# Patient Record
Sex: Female | Born: 1960
Health system: Southern US, Community
[De-identification: ages and names within clinical notes are randomized; demographics above are authoritative.]

## PROBLEM LIST (undated history)

## (undated) DIAGNOSIS — R896 Abnormal cytological findings in specimens from other organs, systems and tissues: Secondary | ICD-10-CM

## (undated) DIAGNOSIS — M810 Age-related osteoporosis without current pathological fracture: Secondary | ICD-10-CM

## (undated) DIAGNOSIS — K649 Unspecified hemorrhoids: Secondary | ICD-10-CM

## (undated) DIAGNOSIS — E785 Hyperlipidemia, unspecified: Secondary | ICD-10-CM

## (undated) DIAGNOSIS — E559 Vitamin D deficiency, unspecified: Secondary | ICD-10-CM

## (undated) DIAGNOSIS — B009 Herpesviral infection, unspecified: Secondary | ICD-10-CM

## (undated) HISTORY — DX: Hyperlipidemia, unspecified: E78.5

## (undated) HISTORY — DX: Herpesviral infection, unspecified: B00.9

## (undated) HISTORY — PX: ESOPHAGOGASTRODUODENOSCOPY: SHX1529

## (undated) HISTORY — DX: Abnormal cytological findings in specimens from other organs, systems and tissues: R89.6

## (undated) HISTORY — DX: Unspecified hemorrhoids: K64.9

## (undated) HISTORY — DX: Vitamin D deficiency, unspecified: E55.9

## (undated) HISTORY — DX: Age-related osteoporosis without current pathological fracture: M81.0

---

## 1984-07-10 HISTORY — PX: BREAST BIOPSY: SHX20

## 1995-07-11 DIAGNOSIS — IMO0001 Reserved for inherently not codable concepts without codable children: Secondary | ICD-10-CM

## 1995-07-11 HISTORY — DX: Reserved for inherently not codable concepts without codable children: IMO0001

## 1999-05-04 ENCOUNTER — Other Ambulatory Visit: Admission: RE | Admit: 1999-05-04 | Discharge: 1999-05-04 | Payer: Self-pay | Admitting: Obstetrics and Gynecology

## 2000-05-03 ENCOUNTER — Other Ambulatory Visit: Admission: RE | Admit: 2000-05-03 | Discharge: 2000-05-03 | Payer: Self-pay | Admitting: Obstetrics and Gynecology

## 2001-05-27 ENCOUNTER — Other Ambulatory Visit: Admission: RE | Admit: 2001-05-27 | Discharge: 2001-05-27 | Payer: Self-pay | Admitting: Obstetrics and Gynecology

## 2001-10-22 ENCOUNTER — Other Ambulatory Visit: Admission: RE | Admit: 2001-10-22 | Discharge: 2001-10-22 | Payer: Self-pay | Admitting: Obstetrics and Gynecology

## 2002-02-21 ENCOUNTER — Other Ambulatory Visit: Admission: RE | Admit: 2002-02-21 | Discharge: 2002-02-21 | Payer: Self-pay | Admitting: Obstetrics and Gynecology

## 2002-05-30 ENCOUNTER — Other Ambulatory Visit: Admission: RE | Admit: 2002-05-30 | Discharge: 2002-05-30 | Payer: Self-pay | Admitting: Obstetrics and Gynecology

## 2003-06-02 ENCOUNTER — Other Ambulatory Visit: Admission: RE | Admit: 2003-06-02 | Discharge: 2003-06-02 | Payer: Self-pay | Admitting: Obstetrics and Gynecology

## 2003-10-09 DIAGNOSIS — B009 Herpesviral infection, unspecified: Secondary | ICD-10-CM

## 2003-10-09 HISTORY — DX: Herpesviral infection, unspecified: B00.9

## 2004-06-01 ENCOUNTER — Other Ambulatory Visit: Admission: RE | Admit: 2004-06-01 | Discharge: 2004-06-01 | Payer: Self-pay | Admitting: Obstetrics and Gynecology

## 2005-04-25 ENCOUNTER — Encounter: Admission: RE | Admit: 2005-04-25 | Discharge: 2005-04-25 | Payer: Self-pay | Admitting: Unknown Physician Specialty

## 2005-07-11 ENCOUNTER — Other Ambulatory Visit: Admission: RE | Admit: 2005-07-11 | Discharge: 2005-07-11 | Payer: Self-pay | Admitting: Obstetrics and Gynecology

## 2005-10-27 ENCOUNTER — Encounter: Admission: RE | Admit: 2005-10-27 | Discharge: 2005-10-27 | Payer: Self-pay | Admitting: Obstetrics & Gynecology

## 2006-08-10 ENCOUNTER — Other Ambulatory Visit: Admission: RE | Admit: 2006-08-10 | Discharge: 2006-08-10 | Payer: Self-pay | Admitting: Obstetrics & Gynecology

## 2007-08-22 ENCOUNTER — Other Ambulatory Visit: Admission: RE | Admit: 2007-08-22 | Discharge: 2007-08-22 | Payer: Self-pay | Admitting: Obstetrics & Gynecology

## 2008-08-26 ENCOUNTER — Other Ambulatory Visit: Admission: RE | Admit: 2008-08-26 | Discharge: 2008-08-26 | Payer: Self-pay | Admitting: Obstetrics & Gynecology

## 2009-02-07 LAB — HM COLONOSCOPY: HM Colonoscopy: NORMAL

## 2012-03-10 DIAGNOSIS — M810 Age-related osteoporosis without current pathological fracture: Secondary | ICD-10-CM

## 2012-03-10 HISTORY — DX: Age-related osteoporosis without current pathological fracture: M81.0

## 2013-02-06 ENCOUNTER — Encounter: Payer: Self-pay | Admitting: Obstetrics & Gynecology

## 2013-02-10 ENCOUNTER — Encounter: Payer: Self-pay | Admitting: Obstetrics & Gynecology

## 2013-02-10 ENCOUNTER — Ambulatory Visit (INDEPENDENT_AMBULATORY_CARE_PROVIDER_SITE_OTHER): Payer: BC Managed Care – PPO | Admitting: Obstetrics & Gynecology

## 2013-02-10 VITALS — BP 110/66 | HR 60 | Resp 16 | Ht 63.75 in | Wt 136.0 lb

## 2013-02-10 DIAGNOSIS — Z01419 Encounter for gynecological examination (general) (routine) without abnormal findings: Secondary | ICD-10-CM

## 2013-02-10 MED ORDER — VALACYCLOVIR HCL 500 MG PO TABS: 500.0000 mg | ORAL_TABLET | Freq: Every day | ORAL | Status: DC

## 2013-02-10 MED ORDER — ALENDRONATE SODIUM 70 MG PO TABS: 70.0000 mg | ORAL_TABLET | ORAL | Status: DC

## 2013-02-10 MED ORDER — FLUCONAZOLE 150 MG PO TABS: 150.0000 mg | ORAL_TABLET | Freq: Once | ORAL | Status: DC

## 2013-02-10 MED ORDER — ESTROGENS, CONJUGATED 0.625 MG/GM VA CREA: TOPICAL_CREAM | Freq: Every day | VAGINAL | Status: DC

## 2013-02-10 NOTE — Patient Instructions (Signed)

## 2013-02-10 NOTE — Progress Notes (Signed)
52 y.o. G19P1001 Married WF here for annual exam.  Last year reported having recurrent itchy vulvar lesions.  She came in a saw Shirlyn Goltz and was diagnosed with HSV.  Now on Valtrex but not using as suppression.  Will call if wants to change this.  Had three outbreaks during last year.    Patient's last menstrual period was 07/10/2005.          Sexually active: yes  The current method of family planning is post menopausal status.    Exercising: yes   Smoker:  no  Health Maintenance: Pap:  02/01/12 WNL/negative HR HPV History of abnormal Pap:  yes MMG:  01/30/12 normal Colonoscopy:  8/10 repeat in 10 years, Dr. Loreta Ave BMD:   02/28/12 osteoporosis TDaP:  5/08 Screening Labs: at work, Hb today: at work, Urine today: at work   reports that she has never smoked. She has never used smokeless tobacco. She reports that she drinks about 0.5 ounces of alcohol per week. She reports that she does not use illicit drugs.  Past Medical History  Diagnosis Date  . Cervicitis 10/95  . ASCUS on Pap smear 1/97  . HSV-2 (herpes simplex virus 2) infection 4/05  . Osteoporosis 9/13  . Vitamin D deficiency   . Hemorrhoid   . Mastodynia     cyclic  . Gastritis     Past Surgical History  Procedure Laterality Date  . Breast biopsy  1986  . Cardiolite exercise    . Esophagogastroduodenoscopy      gastritis    Current Outpatient Prescriptions  Medication Sig Dispense Refill  . alendronate (FOSAMAX) 70 MG tablet Take 70 mg by mouth every 7 (seven) days. Take with a full glass of water on an empty stomach.      . Aspirin-Acetaminophen-Caffeine (EXCEDRIN MIGRAINE PO) Take by mouth as needed.      . conjugated estrogens (PREMARIN) vaginal cream Place vaginally daily.      . Naproxen Sodium (ALEVE PO) Take by mouth as needed.       No current facility-administered medications for this visit.    History reviewed. No pertinent family history.  ROS:  Pertinent items are noted in HPI.  Otherwise, a  comprehensive ROS was negative.  Exam:   BP 110/66  Pulse 60  Resp 16  Ht 5' 3.75" (1.619 m)  Wt 136 lb (61.689 kg)  BMI 23.53 kg/m2  LMP 07/10/2005  Weight change: +2lb   Height: 5' 3.75" (161.9 cm)  Ht Readings from Last 3 Encounters:  02/10/13 5' 3.75" (1.619 m)    General appearance: alert, cooperative and appears stated age Head: Normocephalic, without obvious abnormality, atraumatic Neck: no adenopathy, supple, symmetrical, trachea midline and thyroid normal to inspection and palpation Lungs: clear to auscultation bilaterally Breasts: normal appearance, no masses or tenderness Heart: regular rate and rhythm Abdomen: soft, non-tender; bowel sounds normal; no masses,  no organomegaly Extremities: extremities normal, atraumatic, no cyanosis or edema Skin: Skin color, texture, turgor normal. No rashes or lesions Lymph nodes: Cervical, supraclavicular, and axillary nodes normal. No abnormal inguinal nodes palpated Neurologic: Grossly normal   Pelvic: External genitalia:  no lesions              Urethra:  normal appearing urethra with no masses, tenderness or lesions              Bartholins and Skenes: normal                 Vagina: normal  appearing vagina with normal color and discharge, no lesions              Cervix: no lesions              Pap taken: no Bimanual Exam:  Uterus:  normal size, contour, position, consistency, mobility, non-tender              Adnexa: normal adnexa and no mass, fullness, tenderness               Rectovaginal: Confirms               Anus:  normal sphincter tone, no lesions  A:  Well Woman with normal exam H/O vulvar HSV PMP, with atrophic changes, uses vaginal premarin Recurrent vaginal yeast Osteoporosis  P:   Mammogram yearly pap smear with neg HR HPV 7/13 BMD next year Fosamax 70 mg weekly.  #12/4 RF Diflucan 150mg  once and repeat 72 hours.  #2RF. Rx for premarin cream Rx for Valtrex 500mg  q day increase to BID x 3 days with  outbreaks return annually or prn  An After Visit Summary was printed and given to the patient.

## 2013-05-15 ENCOUNTER — Other Ambulatory Visit: Payer: Self-pay

## 2013-08-04 ENCOUNTER — Encounter: Payer: Self-pay | Admitting: Obstetrics & Gynecology

## 2013-08-04 MED ORDER — ALENDRONATE SODIUM 70 MG PO TABS: 70.0000 mg | ORAL_TABLET | ORAL | Status: DC

## 2013-08-04 NOTE — Telephone Encounter (Signed)
rx done as 90 day supply per pt request.

## 2013-08-06 MED ORDER — ALENDRONATE SODIUM 70 MG PO TABS: 70.0000 mg | ORAL_TABLET | ORAL | Status: DC

## 2013-08-06 NOTE — Telephone Encounter (Signed)
rx to pharmacy per pt request.  84 day supply.  Notified via MyChart of this being done.

## 2013-08-06 NOTE — Addendum Note (Signed)
Addended by: Jerene BearsMILLER, Shatara Stanek S on: 08/06/2013 11:47 AM   Modules accepted: Orders

## 2013-08-19 ENCOUNTER — Telehealth: Payer: Self-pay

## 2013-08-19 NOTE — Telephone Encounter (Signed)
Lmtcb//kn 

## 2013-12-22 NOTE — Telephone Encounter (Signed)
RX called to pharmacy//kn

## 2014-03-03 ENCOUNTER — Telehealth: Payer: Self-pay | Admitting: Obstetrics & Gynecology

## 2014-03-03 NOTE — Telephone Encounter (Signed)
lmtcb re: dr cx aex 03/10/14 and rs'd to 03/09/14.

## 2014-03-05 NOTE — Telephone Encounter (Signed)
Confirmed.

## 2014-03-09 ENCOUNTER — Encounter: Payer: Self-pay | Admitting: Obstetrics & Gynecology

## 2014-03-09 ENCOUNTER — Ambulatory Visit (INDEPENDENT_AMBULATORY_CARE_PROVIDER_SITE_OTHER): Payer: BC Managed Care – PPO | Admitting: Obstetrics & Gynecology

## 2014-03-09 VITALS — BP 108/60 | HR 60 | Ht 63.75 in | Wt 147.0 lb

## 2014-03-09 DIAGNOSIS — Z124 Encounter for screening for malignant neoplasm of cervix: Secondary | ICD-10-CM

## 2014-03-09 DIAGNOSIS — Z01419 Encounter for gynecological examination (general) (routine) without abnormal findings: Secondary | ICD-10-CM

## 2014-03-09 MED ORDER — ESTROGENS, CONJUGATED 0.625 MG/GM VA CREA: TOPICAL_CREAM | VAGINAL | Status: DC

## 2014-03-09 MED ORDER — VALACYCLOVIR HCL 500 MG PO TABS: 500.0000 mg | ORAL_TABLET | Freq: Every day | ORAL | Status: DC

## 2014-03-09 NOTE — Progress Notes (Signed)
53 y.o. G1P1001 MarriedCaucasianF here for annual exam.  Doing well.  Brought labs with her.  Reviewed with her.  Vit D was 21 with labs at work.  Wants to know what to take.  Discussed with pt.  No vaginal bleeding.         Sexually active: Yes.    The current method of family planning is post menopausal status.    Exercising: Yes.    Home exercise routine includes walking , elliptical and stationary bike. Smoker:  no  Health Maintenance: Pap:  01/30/12 WNL, neg HR HPV History of abnormal Pap:  yes MMG:  05/20/13 Bi-Rads Neg Colonoscopy:  8/10 repeat in 10 years, Dr. Loreta Ave BMD:   02/28/12 osteoporosis.  Repeat this year. TDaP:  11/2006 Screening Labs: Pt brought results, Hb today: Pt brought results, Urine today: Neg, ph: 5.0   reports that she has never smoked. She has never used smokeless tobacco. She reports that she drinks about .5 - 1 ounces of alcohol per week. She reports that she does not use illicit drugs.  Past Medical History  Diagnosis Date  . ASCUS on Pap smear 1/97  . HSV-2 (herpes simplex virus 2) infection 4/05  . Osteoporosis 9/13  . Vitamin D deficiency   . Hemorrhoid     Past Surgical History  Procedure Laterality Date  . Breast biopsy  1986  . Esophagogastroduodenoscopy      Dr. Loreta Ave    Current Outpatient Prescriptions  Medication Sig Dispense Refill  . alendronate (FOSAMAX) 70 MG tablet Take 1 tablet (70 mg total) by mouth every 7 (seven) days. Take with a full glass of water on an empty stomach.  12 tablet  4  . Aspirin-Acetaminophen-Caffeine (EXCEDRIN MIGRAINE PO) Take by mouth as needed.      . conjugated estrogens (PREMARIN) vaginal cream Place vaginally daily.  42.5 g  4  . Naproxen Sodium (ALEVE PO) Take by mouth as needed.      . valACYclovir (VALTREX) 500 MG tablet Take 1 tablet (500 mg total) by mouth daily. 1 tablet po QD.  30 tablet  2  . fluconazole (DIFLUCAN) 150 MG tablet Take 1 tablet (150 mg total) by mouth once. Take one tablet.  Repeat in  72 hours if symptoms are not completely resolved.  2 tablet  2   No current facility-administered medications for this visit.    No family history on file.  ROS:  Pertinent items are noted in HPI.  Otherwise, a comprehensive ROS was negative.  Exam:   BP 108/60  Pulse 60  Ht 5' 3.75" (1.619 m)  Wt 147 lb (66.679 kg)  BMI 25.44 kg/m2  LMP 01/26/2013  Weight change: +11#   Height: 5' 3.75" (161.9 cm)  Ht Readings from Last 3 Encounters:  03/09/14 5' 3.75" (1.619 m)  02/10/13 5' 3.75" (1.619 m)    General appearance: alert, cooperative and appears stated age Head: Normocephalic, without obvious abnormality, atraumatic Neck: no adenopathy, supple, symmetrical, trachea midline and thyroid normal to inspection and palpation Lungs: clear to auscultation bilaterally Breasts: normal appearance, no masses or tenderness Heart: regular rate and rhythm Abdomen: soft, non-tender; bowel sounds normal; no masses,  no organomegaly Extremities: extremities normal, atraumatic, no cyanosis or edema Skin: Skin color, texture, turgor normal. No rashes or lesions Lymph nodes: Cervical, supraclavicular, and axillary nodes normal. No abnormal inguinal nodes palpated Neurologic: Grossly normal   Pelvic: External genitalia:  no lesions  Urethra:  normal appearing urethra with no masses, tenderness or lesions              Bartholins and Skenes: normal                 Vagina: normal appearing vagina with normal color and discharge, no lesions              Cervix: no lesions              Pap taken: Yes.   Bimanual Exam:  Uterus:  normal size, contour, position, consistency, mobility, non-tender              Adnexa: normal adnexa and no mass, fullness, tenderness               Rectovaginal: Confirms               Anus:  normal sphincter tone, no lesions  A:  Well Woman with normal exam  H/O vulvar HSV  PMP, with atrophic changes, uses vaginal premarin  Osteoporosis   P: Mammogram  yearly  pap smear with neg HR HPV 7/13.  Pap only today. BMD with MMG in November. Fosamax 70 mg weekly.  No rx needed yet. Rx for premarin cream  Rx for Valtrex  q day increase to BID x 3 days with outbreaks  return annually or prn  An After Visit Summary was printed and given to the patient.

## 2014-03-10 ENCOUNTER — Ambulatory Visit: Payer: BC Managed Care – PPO | Admitting: Obstetrics & Gynecology

## 2014-03-11 LAB — IPS PAP TEST WITH REFLEX TO HPV

## 2014-05-11 ENCOUNTER — Encounter: Payer: Self-pay | Admitting: Obstetrics & Gynecology

## 2014-06-23 ENCOUNTER — Telehealth: Payer: Self-pay

## 2014-06-23 NOTE — Telephone Encounter (Signed)
Patient is returning a call to Kelly °

## 2014-06-23 NOTE — Telephone Encounter (Signed)
lmtcb to discuss BMD from [facility]-chart on [place] desk//kn

## 2014-06-24 NOTE — Telephone Encounter (Signed)
Pt is scheduled for 07/08/14 @ 3pm with Dr. Hyacinth MeekerMiller.  [facility]

## 2014-07-08 ENCOUNTER — Encounter: Payer: Self-pay | Admitting: Obstetrics & Gynecology

## 2014-07-08 ENCOUNTER — Ambulatory Visit (INDEPENDENT_AMBULATORY_CARE_PROVIDER_SITE_OTHER): Payer: BC Managed Care – PPO | Admitting: Obstetrics & Gynecology

## 2014-07-08 VITALS — BP 110/64 | Ht 63.75 in | Wt 139.6 lb

## 2014-07-08 DIAGNOSIS — M810 Age-related osteoporosis without current pathological fracture: Secondary | ICD-10-CM

## 2014-07-08 MED ORDER — ALENDRONATE SODIUM 70 MG PO TABS: 70.0000 mg | ORAL_TABLET | ORAL | Status: DC

## 2014-07-08 NOTE — Progress Notes (Signed)
53 yo G1P1 MWF here to discuss [facility].  Pt with hx of osteoporosis with T score -2.9 (average measurement) in spine.  Last BMD was a little over two years ago.  Pt started on Foxamax at that time and has done well with it.  This BMD continues to show osteoporosis in spine but is improved with T score average of -2.5.  This represents a 6% increase in BMD.  Also, both hip measurements are improved as well.  Both still in the osteopenia range.   Pt is not taking supplemental calcium.  D/W pt needs total of 1200-1500mg  calcium daily (including dietary) as well as Vit D.  She is supplementing with Vit D.  Pt is going to do a "dairy diary" for a week and average what she is getting so she knows how much to supplement.  She will continue with [phone] IU Vit D daily.  Assessment:  Osteoporosis but improved with most recent BMD.  T Score now -2.5  Plan:  Continue Fosamax weekly.  70mg  first thing in am and on empty stomach for 1 hour.  #12/4 RF Increase calcium supplementation Continue Vit D supplements Continue with weight bearing exercise (pt exercises 4-5 times weekly)  ~15 minutes spent with patient >50% of time was in face to face discussion of above.

## 2014-10-27 ENCOUNTER — Encounter: Payer: Self-pay | Admitting: Obstetrics & Gynecology

## 2014-10-27 MED ORDER — ALENDRONATE SODIUM 70 MG PO TABS: 70.0000 mg | ORAL_TABLET | ORAL | Status: DC

## 2014-10-27 NOTE — Telephone Encounter (Signed)
Rx sent to pharmacy after pt request from MyChart from pt.

## 2015-04-13 ENCOUNTER — Ambulatory Visit: Payer: BC Managed Care – PPO | Admitting: Obstetrics & Gynecology

## 2015-05-12 ENCOUNTER — Telehealth: Payer: Self-pay | Admitting: Obstetrics & Gynecology

## 2015-05-12 NOTE — Telephone Encounter (Signed)
Left patient a message to call back to reschedule a future appointment that was cancelled by the provider. °

## 2015-05-19 ENCOUNTER — Encounter: Payer: Self-pay | Admitting: Nurse Practitioner

## 2015-05-19 ENCOUNTER — Ambulatory Visit (INDEPENDENT_AMBULATORY_CARE_PROVIDER_SITE_OTHER): Payer: BLUE CROSS/BLUE SHIELD | Admitting: Nurse Practitioner

## 2015-05-19 VITALS — BP 96/76 | HR 62 | Resp 14 | Ht 63.5 in | Wt 140.0 lb

## 2015-05-19 DIAGNOSIS — Z01419 Encounter for gynecological examination (general) (routine) without abnormal findings: Secondary | ICD-10-CM | POA: Diagnosis not present

## 2015-05-19 MED ORDER — VALACYCLOVIR HCL 500 MG PO TABS: 500.0000 mg | ORAL_TABLET | Freq: Every day | ORAL | Status: DC

## 2015-05-19 MED ORDER — ESTROGENS, CONJUGATED 0.625 MG/GM VA CREA: TOPICAL_CREAM | VAGINAL | Status: DC

## 2015-05-19 NOTE — Progress Notes (Signed)
54 y.o. G39P1001 Married  Caucasian Fe here for annual exam.  No new diagnoses since last here.  Patient's last menstrual period was 07/10/2002.          Sexually active: Yes.    The current method of family planning is post menopausal status.    Exercising: Yes.    Walking at least 30 min 5 x weekly Smoker:  no  Health Maintenance: Pap:  03/09/14 Neg MMG:  06/01/14 BIRADS1:Neg - will schedule Colonoscopy:  02/2009 Normal - repeat 10 years 11/2014 IFOB was neg BMD:   06/01/14 Osteoporosis =  Fosamax since 02/2012 TDaP:  11/29/2006 Labs: at work   reports that she has never smoked. She has never used smokeless tobacco. She reports that she drinks about 0.5 - 1.0 oz of alcohol per week. She reports that she does not use illicit drugs.  Past Medical History  Diagnosis Date  . ASCUS on Pap smear 1/97  . HSV-2 (herpes simplex virus 2) infection 4/05  . Osteoporosis 9/13  . Vitamin D deficiency   . Hemorrhoid     Past Surgical History  Procedure Laterality Date  . Breast biopsy  1986  . Esophagogastroduodenoscopy      Dr. Loreta Ave    Current Outpatient Prescriptions  Medication Sig Dispense Refill  . alendronate (FOSAMAX) 70 MG tablet Take 1 tablet (70 mg total) by mouth every 7 (seven) days. Take with a full glass of water on an empty stomach. 12 tablet 4  . Aspirin-Acetaminophen-Caffeine (EXCEDRIN MIGRAINE PO) Take by mouth as needed.    . conjugated estrogens (PREMARIN) vaginal cream 1/2 gram vaginally twice weekly 42.5 g 4  . Naproxen Sodium (ALEVE PO) Take by mouth as needed.    . valACYclovir (VALTREX) 500 MG tablet Take 1 tablet (500 mg total) by mouth daily. 1 tablet po QD. 90 tablet 4  . fluconazole (DIFLUCAN) 150 MG tablet Take 1 tablet (150 mg total) by mouth once. Take one tablet.  Repeat in 72 hours if symptoms are not completely resolved. (Patient not taking: Reported on 05/19/2015) 2 tablet 2   No current facility-administered medications for this visit.    History  reviewed. No pertinent family history.  ROS:  Pertinent items are noted in HPI.  Otherwise, a comprehensive ROS was negative.  Exam:   BP 96/76 mmHg  Pulse 62  Resp 14  Ht 5' 3.5" (1.613 m)  Wt 140 lb (63.504 kg)  BMI 24.41 kg/m2  LMP 07/10/2002 Height: 5' 3.5" (161.3 cm) Ht Readings from Last 3 Encounters:  05/19/15 5' 3.5" (1.613 m)  07/08/14 5' 3.75" (1.619 m)  03/09/14 5' 3.75" (1.619 m)    General appearance: alert, cooperative and appears stated age Head: Normocephalic, without obvious abnormality, atraumatic Neck: no adenopathy, supple, symmetrical, trachea midline and thyroid normal to inspection and palpation Lungs: clear to auscultation bilaterally Breasts: normal appearance, no masses or tenderness Heart: regular rate and rhythm Abdomen: soft, non-tender; no masses,  no organomegaly Extremities: extremities normal, atraumatic, no cyanosis or edema Skin: Skin color, texture, turgor normal. No rashes or lesions Lymph nodes: Cervical, supraclavicular, and axillary nodes normal. No abnormal inguinal nodes palpated Neurologic: Grossly normal   Pelvic: External genitalia:  no lesions              Urethra:  normal appearing urethra with no masses, tenderness or lesions              Bartholin's and Skene's: normal  Vagina: normal appearing vagina with normal color and discharge, no lesions              Cervix: anteverted              Pap taken: Yes.   Bimanual Exam:  Uterus:  normal size, contour, position, consistency, mobility, non-tender              Adnexa: no mass, fullness, tenderness               Rectovaginal: Confirms               Anus:  normal sphincter tone, no lesions  Chaperone present: yes  A:  Well Woman with normal exam  Postmenopausal - no HRT  H/O HSVII - culture proven 10/2003  PMP, with atrophic changes, uses vaginal Premarin   Osteoporosis on Fosamax since 02/2012   P:   Reviewed health and wellness pertinent to exam  Pap  smear as above  Mammogram is due end of this month and will schedule  Refill on Premarin vaginal cream, Valtrex, she does not need a refill on Fosamax yet.  Will update labs  Counseled on breast self exam, mammography screening, adequate intake of calcium and vitamin D, diet and exercise, Kegel's exercises return annually or prn  An After Visit Summary was printed and given to the patient.

## 2015-05-19 NOTE — Patient Instructions (Signed)

## 2015-05-20 LAB — HEPATITIS C ANTIBODY: HCV Ab: NEGATIVE

## 2015-05-20 LAB — HIV ANTIBODY (ROUTINE TESTING W REFLEX): HIV 1&2 Ab, 4th Generation: NONREACTIVE

## 2015-05-21 LAB — IPS PAP TEST WITH HPV

## 2015-05-24 NOTE — Progress Notes (Signed)
Encounter reviewed by Dr. Brook Amundson C. Silva.  

## 2015-07-06 ENCOUNTER — Ambulatory Visit: Payer: Self-pay | Admitting: Obstetrics & Gynecology

## 2015-09-06 ENCOUNTER — Telehealth: Payer: Self-pay | Admitting: Nurse Practitioner

## 2015-09-06 NOTE — Telephone Encounter (Signed)
Spoke with patient. Advised of message as seen below from Dr.Miller. She is agreeable and verbalizes understanding. She will call with an update on Friday 09/10/2015.  Routing to provider for final review. Patient agreeable to disposition. Will close encounter.

## 2015-09-06 NOTE — Telephone Encounter (Signed)
Take tums up to four times daily and Zantac  bid for two to three days.  She needs to give update in two to three days.  Should be better.  Hold future Fosamax until this is resolved.  If doesn't get better quickly, may need GI evaluation.  Will try OTC products to start with right now.

## 2015-09-06 NOTE — Telephone Encounter (Signed)
Patient calling with concerns about Fosamax. She said, "I am having symptoms of heart burn and burning in my upper chest."

## 2015-09-06 NOTE — Telephone Encounter (Signed)
Spoke with patient. Patient states she has been taking Fosamax 70 mg once per week for years without any side effects. Took last dose of Fosamaz 70 mg on 08/31/2015. On Saturday 09/04/2015 she began to have "heart burn" mid way through the morning that has persisted ever since. States she had eaten 2-3 hours prior to heartburn beginning. "It feels like a burning in the back on my throat and into my chest." Denies any SOB, chest pain, or pain radiating to her left arm. Reports feeling like she needs to eat or drink to relieve discomfort. Eating and drinking does not relieve or aggravate symptoms. Reports taking her Fosamax on an empty stomach with water. She has not tried any OTC medication for relief. Patient is concerned as this symptom appeared suddenly. States she has heartburn 1-2 times per year. "It usually goes away and this hasn't." Advised I will speak with Dr.Miller and return call with further recommendations. She is agreeable.

## 2015-09-10 ENCOUNTER — Encounter: Payer: Self-pay | Admitting: Obstetrics & Gynecology

## 2015-09-10 ENCOUNTER — Telehealth: Payer: Self-pay

## 2015-09-10 NOTE — Telephone Encounter (Signed)
Telephone encounter created for review with Dr.Miller. 

## 2015-09-10 NOTE — Telephone Encounter (Signed)
I would restart the Fosamax but if the reflux occurs again, stop and restart the TUMS and Zantac.  I would then recommend an appt to discuss options.

## 2015-09-10 NOTE — Telephone Encounter (Signed)
Visit Follow-Up Question  Message [id number]   From  Rhunette CroftKimberly J Arena   To  Jerene BearsMary S Miller, MD   Sent  09/10/2015 8:27 AM     Monday, (09/06/2015) you advised OTC Zantac 75 mg (2X per day) and TUMS (4X per day) for esophageal burning-possibly related to Fosomax. Symptoms disappeared completely by evening of 2/28. Should I resume my Fosomax dose next week? If yes, if symptoms return, treat with OTC meds? For what length of time before contacting your office again?   Annabell SabalKimberly Silva [phone]      Responsible Party    Pool - [facility] Pool No one has taken responsibility for this message.     No actions have been taken on this message.     Patient is providing follow up regarding recommendations given on 09/06/2015 (please see telephone encounter). Routing to Dr.Miller for review and advise of mychart message.

## 2015-09-13 NOTE — Telephone Encounter (Signed)
Spoke with patient. Advised of message as seen below from Dr.Miller. She is agreeable and verbalizes understanding. She will restart Fosamax tomorrow and schedule OV if reflux reoccurs.   Routing to provider for final review. Patient agreeable to disposition. Will close encounter.

## 2015-12-20 ENCOUNTER — Other Ambulatory Visit: Payer: Self-pay | Admitting: Obstetrics & Gynecology

## 2015-12-20 NOTE — Telephone Encounter (Signed)
Medication refill request: Fosamax Last AEX:  05-19-15 Next AEX: 05-19-16 Last MMG (if hormonal medication request): 11-23-15WNL  Last DEXA: 06-01-14 osteoporosis  Refill authorized: please advise

## 2016-04-26 ENCOUNTER — Telehealth: Payer: Self-pay | Admitting: Nurse Practitioner

## 2016-04-26 NOTE — Telephone Encounter (Signed)
LMTCB about canceled appointment °

## 2016-05-15 ENCOUNTER — Encounter: Payer: Self-pay | Admitting: Obstetrics & Gynecology

## 2016-05-19 ENCOUNTER — Ambulatory Visit: Payer: BLUE CROSS/BLUE SHIELD | Admitting: Nurse Practitioner

## 2016-06-07 DIAGNOSIS — Z1231 Encounter for screening mammogram for malignant neoplasm of breast: Secondary | ICD-10-CM | POA: Diagnosis not present

## 2016-06-07 DIAGNOSIS — M810 Age-related osteoporosis without current pathological fracture: Secondary | ICD-10-CM | POA: Diagnosis not present

## 2016-06-13 ENCOUNTER — Ambulatory Visit (INDEPENDENT_AMBULATORY_CARE_PROVIDER_SITE_OTHER): Payer: BLUE CROSS/BLUE SHIELD | Admitting: Certified Nurse Midwife

## 2016-06-13 ENCOUNTER — Encounter: Payer: Self-pay | Admitting: Certified Nurse Midwife

## 2016-06-13 VITALS — BP 98/62 | HR 70 | Resp 16 | Ht 63.75 in | Wt 150.0 lb

## 2016-06-13 DIAGNOSIS — Z23 Encounter for immunization: Secondary | ICD-10-CM

## 2016-06-13 DIAGNOSIS — Z01419 Encounter for gynecological examination (general) (routine) without abnormal findings: Secondary | ICD-10-CM | POA: Diagnosis not present

## 2016-06-13 DIAGNOSIS — N390 Urinary tract infection, site not specified: Secondary | ICD-10-CM

## 2016-06-13 DIAGNOSIS — Z0000 Encounter for general adult medical examination without abnormal findings: Secondary | ICD-10-CM

## 2016-06-13 DIAGNOSIS — N952 Postmenopausal atrophic vaginitis: Secondary | ICD-10-CM

## 2016-06-13 LAB — POCT URINALYSIS DIPSTICK
Bilirubin, UA: NEGATIVE
Blood, UA: NEGATIVE
Glucose, UA: NEGATIVE
Ketones, UA: NEGATIVE
Leukocytes, UA: NEGATIVE
Nitrite, UA: NEGATIVE
Protein, UA: NEGATIVE
Urobilinogen, UA: NEGATIVE
pH, UA: 5

## 2016-06-13 MED ORDER — ESTROGENS, CONJUGATED 0.625 MG/GM VA CREA: TOPICAL_CREAM | VAGINAL | 4 refills | Status: DC

## 2016-06-13 MED ORDER — NITROFURANTOIN MONOHYD MACRO 100 MG PO CAPS: 100.0000 mg | ORAL_CAPSULE | Freq: Two times a day (BID) | ORAL | 0 refills | Status: DC

## 2016-06-13 NOTE — Patient Instructions (Signed)
EXERCISE AND DIET:  We recommended that you start or continue a regular exercise program for good health. Regular exercise means any activity that makes your heart beat faster and makes you sweat.  We recommend exercising at least 30 minutes per day at least 3 days a week, preferably 4 or 5.  We also recommend a diet low in fat and sugar.  Inactivity, poor dietary choices and obesity can cause diabetes, heart attack, stroke, and kidney damage, among others.    ALCOHOL AND SMOKING:  Women should limit their alcohol intake to no more than 7 drinks/beers/glasses of wine (combined, not each!) per week. Moderation of alcohol intake to this level decreases your risk of breast cancer and liver damage. And of course, no recreational drugs are part of a healthy lifestyle.  And absolutely no smoking or even second hand smoke. Most people know smoking can cause heart and lung diseases, but did you know it also contributes to weakening of your bones? Aging of your skin?  Yellowing of your teeth and nails?  CALCIUM AND VITAMIN D:  Adequate intake of calcium and Vitamin D are recommended.  The recommendations for exact amounts of these supplements seem to change often, but generally speaking 600 mg of calcium (either carbonate or citrate) and 800 units of Vitamin D per day seems prudent. Certain women may benefit from higher intake of Vitamin D.  If you are among these women, your doctor will have told you during your visit.    PAP SMEARS:  Pap smears, to check for cervical cancer or precancers,  have traditionally been done yearly, although recent scientific advances have shown that most women can have pap smears less often.  However, every woman still should have a physical exam from her gynecologist every 57 years. It will include a breast check, inspection of the vulva and vagina to check for abnormal growths or skin changes, a visual exam of the cervix, and then an exam to evaluate the size and shape of the uterus and  ovaries.  And after 57 years of age, a rectal exam is indicated to check for rectal cancers. We will also provide age appropriate advice regarding health maintenance, like when you should have certain vaccines, screening for sexually transmitted diseases, bone density testing, colonoscopy, mammograms, etc.   MAMMOGRAMS:  All women over 57 years old should have a yearly mammogram. Many facilities now offer a "3D" mammogram, which may cost around $50 extra out of pocket. If possible,  we recommend you accept the option to have the 3D mammogram performed.  It both reduces the number of women who will be called back for extra views which then turn out to be normal, and it is better than the routine mammogram at detecting truly abnormal areas.    COLONOSCOPY:  Colonoscopy to screen for colon cancer is recommended for all women at age 57.  We know, you hate the idea of the prep.  We agree, BUT, having colon cancer and not knowing it is worse!!  Colon cancer so often starts as a polyp that can be seen and removed at colonscopy, which can quite literally save your life!  And if your first colonoscopy is normal and you have no family history of colon cancer, most women don't have to have it again for 10 years.  Once every ten years, you can do something that may end up saving your life, right?  We will be happy to help you get it scheduled when you are ready.    Be sure to check your insurance coverage so you understand how much it will cost.  It may be covered as a preventative service at no cost, but you should check your particular policy.      Atrophic Vaginitis Introduction Atrophic vaginitis is when the tissues that line the vagina become dry and thin. This is caused by a drop in estrogen. Estrogen helps:  To keep the vagina moist.  To make a clear fluid that helps:  To lubricate the vagina for sex.  To protect the vagina from infection. If the lining of the vagina is dry and thin, it may:  Make sex  painful. It may also cause bleeding.  Cause a feeling of:  Burning.  Irritation.  Itchiness.  Make an exam of your vagina painful. It may also cause bleeding.  Make you lose interest in sex.  Cause a burning feeling when you pee.  Make your vaginal fluid (discharge) [place] or yellow. For some women, there are no symptoms. This condition is most common in women who do not get their regular menstrual periods anymore (menopause). This often starts when a woman is 57-57 years old. Follow these instructions at home:  Take medicines only as told by your doctor. Do not use any herbal or alternative medicines unless your doctor says it is okay.  Use over-the-counter products for dryness only as told by your doctor. These include:  Creams.  Lubricants.  Moisturizers.  Do not douche.  Do not use products that can make your vagina dry. These include:  Scented feminine sprays.  Scented tampons.  Scented soaps.  If it hurts to have sex, tell your sexual partner. Contact a doctor if:  Your discharge looks different than normal.  Your vagina has an unusual smell.  You have new symptoms.  Your symptoms do not get better with treatment.  Your symptoms get worse. This information is not intended to replace advice given to you by your health care provider. Make sure you discuss any questions you have with your health care provider. Document Released: 12/13/2007 Document Revised: 12/02/2015 Document Reviewed: 06/17/2014  2017 Elsevier  

## 2016-06-13 NOTE — Progress Notes (Signed)
55 y.o. 281P1001 Married  Caucasian Fe here for annual exam. Menopausal no HRT. Using Premarin Cream cream for vaginal dryness once weekly only with some change. Has had two UTI's after the last two acts of sexual activity. Needs help with management of vaginal dryness. No other health issues today. [facility] with her.Sees PCP prn. Planning a big Christmas!  Patient's last menstrual period was 07/10/2002.          Sexually active: Yes.    The current method of family planning is post menopausal status.    Exercising: Yes.    walk, elliptical Smoker:  no  Health Maintenance: Pap:  05-19-15 neg HPV HR neg MMG:  06-07-16 Colonoscopy:  2010 f/u 2484yrs  BMD:   06-07-16 TDaP:  2008 Shingles: no Pneumonia: no Hep C and HIV: HIV & hep c neg 2016 Labs: poct urine-neg Self breast exam: done monthly   reports that she has never smoked. She has never used smokeless tobacco. She reports that she drinks about 0.6 oz of alcohol per week . She reports that she does not use drugs.  Past Medical History:  Diagnosis Date  . ASCUS on Pap smear 1/97  . Hemorrhoid   . HSV-2 (herpes simplex virus 2) infection 4/05  . Osteoporosis 9/13  . Vitamin D deficiency     Past Surgical History:  Procedure Laterality Date  . BREAST BIOPSY  1986  . ESOPHAGOGASTRODUODENOSCOPY     Dr. Loreta AveMann    Current Outpatient Prescriptions  Medication Sig Dispense Refill  . alendronate (FOSAMAX) 70 MG tablet TAKE 1 TABLET BY MOUTH EVERY 7 DAYS. TAKE WITH A FULL GLASS OF WATER ON AN EMPTY STOMACH 12 tablet 4  . Aspirin-Acetaminophen-Caffeine (EXCEDRIN MIGRAINE PO) Take by mouth as needed.    . conjugated estrogens (PREMARIN) vaginal cream 1/2 gram vaginally twice weekly 42.5 g 4  . Naproxen Sodium (ALEVE PO) Take by mouth as needed.    . valACYclovir (VALTREX) 500 MG tablet Take 1 tablet (500 mg total) by mouth daily. 1 tablet po QD. 90 tablet 4   No current facility-administered medications for  this visit.     History reviewed. No pertinent family history.  ROS:  Pertinent items are noted in HPI.  Otherwise, a comprehensive ROS was negative.  Exam:   BP 98/62   Pulse 70   Resp 16   Ht 5' 3.75" (1.619 m)   Wt 150 lb (68 kg)   LMP 07/10/2002   BMI 25.95 kg/m  Height: 5' 3.75" (161.9 cm) Ht Readings from Last 3 Encounters:  06/13/16 5' 3.75" (1.619 m)  05/19/15 5' 3.5" (1.613 m)  07/08/14 5' 3.75" (1.619 m)    General appearance: alert, cooperative and appears stated age Head: Normocephalic, without obvious abnormality, atraumatic Neck: no adenopathy, supple, symmetrical, trachea midline and thyroid normal to inspection and palpation Lungs: clear to auscultation bilaterally Breasts: normal appearance, no masses or tenderness, No nipple retraction or dimpling, No nipple discharge or bleeding, No axillary or supraclavicular adenopathy Heart: regular rate and rhythm Abdomen: soft, non-tender; no masses,  no organomegaly Extremities: extremities normal, atraumatic, no cyanosis or edema Skin: Skin color, texture, turgor normal. No rashes or lesions Lymph nodes: Cervical, supraclavicular, and axillary nodes normal. No abnormal inguinal nodes palpated Neurologic: Grossly normal   Pelvic: External genitalia:  no lesions              Urethra:  normal appearing urethra with no masses, tenderness or lesions  Bartholin's and Skene's: normal                 Vagina: atrophic appearing vagina with pale color and no discharge, no lesions, tender to touch              Cervix: no cervical motion tenderness and no lesions              Pap taken: No. Bimanual Exam:  Uterus:  normal size, contour, position, consistency, mobility, non-tender and anteverted              Adnexa: normal adnexa and no mass, fullness, tenderness               Rectovaginal: Confirms               Anus:  normal sphincter tone, no lesions  Chaperone present: yes  A:  Well Woman with normal  exam  Menopausal no HRT  Atrophic vaginitis with Premarin cream not working well, but not using appropriately  Post coital UTI's with treatment with PCP  Osteoporosis on Fosamax  History of herpes no outbreaks  Immunization update  P:   Reviewed health and wellness pertinent to exam  Aware of need to evaluate if vaginal bleeding  Discussed vaginal findings of atrophy and need for treatment to avoid post coital UTI and vaginal pain with sexual activity. Discussed using Premarin consistently twice weekly and starting Coconut oil nightly and prior to sexual activity. Instructions given. Patient happy with information and will call if 2 months and schedule OV if no change. Questions addressed  Rx Premarin see order.  Discussed prophylaxis treatment for avoiding UTI of Macrobid one after sexual activity to see if this will stop occurrence. Instructions given. Patient would like to try. Questions addressed.  Rx Macrobid see order  Requested recent report and will review.  Lab Vit. D  Requests TDAP  Pap smear as above not take   counseled on breast self exam, mammography screening, menopause, osteoporosis, adequate intake of calcium and vitamin D, diet and exercise  return annually or prn  An After Visit Summary was printed and given to the patient.

## 2016-06-14 LAB — VITAMIN D 25 HYDROXY (VIT D DEFICIENCY, FRACTURES): Vit D, 25-Hydroxy: 32 ng/mL (ref 30–100)

## 2016-06-15 NOTE — Progress Notes (Signed)
Encounter reviewed Jill Jertson, MD   

## 2016-06-19 ENCOUNTER — Telehealth: Payer: Self-pay | Admitting: *Deleted

## 2016-06-19 NOTE — Telephone Encounter (Signed)
Patient returning call. Please call on work number [phone].

## 2016-06-19 NOTE — Telephone Encounter (Signed)
Called patient. BMD Consult scheduled 06/20/16 @4pm 

## 2016-06-19 NOTE — Telephone Encounter (Signed)
Left voicemail to call back re: BMD results. 

## 2016-06-20 ENCOUNTER — Ambulatory Visit (INDEPENDENT_AMBULATORY_CARE_PROVIDER_SITE_OTHER): Payer: BLUE CROSS/BLUE SHIELD | Admitting: Obstetrics & Gynecology

## 2016-06-20 ENCOUNTER — Other Ambulatory Visit: Payer: Self-pay | Admitting: Obstetrics & Gynecology

## 2016-06-20 VITALS — BP 110/84 | HR 78 | Resp 16 | Ht 63.75 in | Wt 151.0 lb

## 2016-06-20 DIAGNOSIS — M810 Age-related osteoporosis without current pathological fracture: Secondary | ICD-10-CM | POA: Diagnosis not present

## 2016-06-20 NOTE — Progress Notes (Signed)
Subjective:    [age] yrs Married Caucasian [id number]  female here to discuss recent BMD osteoporosis in her spine with T score change of -2.5 to -3.1.  Hip findings showed stable osteopenia.  Due to change in findings, consultation recommended.  Reviewed BMD today with pt and changes discussed.  Work up for secondary causes discussed.  Pt and I discussed treatment options as well.  She has been on Fosamax but this has not stabilized change in her spine.  She is taking calcium and Vit D as well as doing weight bearing exercise.  No recent falls.  No hx of fractures.     Osteoporosis Risk Factors  Nonmodifiable Personal Hx of fracture as an adult: no Hx of fracture in first-degree relative: no Caucasian race: yes Advanced age: no Female sex: yes Dementia: no Poor health/frailty: no  Potentially modifiable: Tobacco use: no Low body weight (<127 lbs): no Estrogen deficiency  early menopause (age <45) or bilateral ovariectomy: yes  prolonged premenopausal amenorrhea (>1 yr): no Low calcium intake (lifelong): yes Alcohol use more than 2 drinks per day: no Recurrent falls: no Inadequate physical activity: no  Current calcium and Vit D intake:  Calcium 800mg  with 1000mg  Vit D  Review of Systems A comprehensive review of systems was negative.     Objective:   PHYSICAL EXAM BP 110/84 (BP Location: Right Arm, Patient Position: Sitting, Cuff Size: Normal)   Pulse 78   Resp 16   Ht 5' 3.75" (1.619 m)   Wt 151 lb (68.5 kg)   LMP 07/10/2002   BMI 26.12 kg/m  General appearance: alert and no distress No other exam performed today  Assessment:   Osteoporos with T score -3.1 in spine Worsening findings while on Fosamax   Plan:   1.  Patient counseled in adequate calcium and vitamin D exposure.  Calcium - 500 - 1000 mg elemental calcium/day in divided doses  Vitamin D - 800 IU/day 2.  PTH with intact calcium, CMP will be obtained.  Recent TSH and Vit D reviewed.  If normal, will  have pt do 24 hour urine for calcium as well. 3.  Medications reveiwed as well as side effects.  Feel Prolia or Reclast are best options for her.  Information provided.  She will review. 4.  Exercise recommended at least 30 minutes 3 times per week.  5.  After therapy change, will repeat BMD in 2 years.    ~30 minutes spent with patient >50% of time was in face to face discussion of above.

## 2016-06-21 ENCOUNTER — Encounter: Payer: Self-pay | Admitting: Obstetrics & Gynecology

## 2016-06-21 LAB — COMPREHENSIVE METABOLIC PANEL
ALT: 15 U/L (ref 6–29)
AST: 21 U/L (ref 10–35)
Albumin: 4.3 g/dL (ref 3.6–5.1)
Alkaline Phosphatase: 70 U/L (ref 33–130)
BUN: 12 mg/dL (ref 7–25)
CO2: 25 mmol/L (ref 20–31)
Calcium: 9.1 mg/dL (ref 8.6–10.4)
Chloride: 108 mmol/L (ref 98–110)
Creat: 0.81 mg/dL (ref 0.50–1.05)
Glucose, Bld: 91 mg/dL (ref 65–99)
Potassium: 4.5 mmol/L (ref 3.5–5.3)
Sodium: 141 mmol/L (ref 135–146)
Total Bilirubin: 0.2 mg/dL (ref 0.2–1.2)
Total Protein: 6.9 g/dL (ref 6.1–8.1)

## 2016-06-21 LAB — PTH, INTACT AND CALCIUM
Calcium: 9.1 mg/dL (ref 8.6–10.4)
PTH: 59 pg/mL (ref 14–64)

## 2016-06-21 LAB — PHOSPHORUS: Phosphorus: 3.8 mg/dL (ref 2.5–4.5)

## 2016-06-22 NOTE — Addendum Note (Signed)
Addended by: Jerene BearsMILLER, Madigan Rosensteel S on: 06/22/2016 01:16 PM   Modules accepted: Orders

## 2016-06-27 ENCOUNTER — Other Ambulatory Visit (INDEPENDENT_AMBULATORY_CARE_PROVIDER_SITE_OTHER): Payer: BLUE CROSS/BLUE SHIELD

## 2016-06-27 DIAGNOSIS — M810 Age-related osteoporosis without current pathological fracture: Secondary | ICD-10-CM

## 2016-06-28 LAB — CALCIUM, URINE, 24 HOUR
Calcium, 24 hour urine: 234 mg/24 h (ref 35–250)
Calcium, Ur: 9 mg/dL

## 2016-09-05 ENCOUNTER — Encounter: Payer: Self-pay | Admitting: Obstetrics & Gynecology

## 2016-09-07 ENCOUNTER — Telehealth: Payer: Self-pay | Admitting: *Deleted

## 2016-09-07 NOTE — Telephone Encounter (Signed)
Call to patients provider [employer] [place] to verify patients pharmacy manager. Spoke with Nolberto HanlonGio, was advised Ecologistpatients pharmacy manager, [facility] [phone].

## 2016-09-07 NOTE — Telephone Encounter (Signed)
Please precert Prolia for the patient.   Cc- Dr. Hyacinth MeekerMiller

## 2016-09-07 NOTE — Telephone Encounter (Signed)
See telephone encounter dated 09/07/16 for review with provider.  

## 2016-09-07 NOTE — Telephone Encounter (Signed)
Spoke with patient, advised will start precert process for prolia. Our insurance and benefits department will follow up with coverage. Patient is agreeable and thankful for return call.

## 2016-09-07 NOTE — Telephone Encounter (Signed)
Dr. Edward JollySilva, patient discussed treatment options at 06/20/16 OV, please advise?     From Rhunette CroftKimberly J Degrave To Jerene BearsMary S Miller, MD Sent 09/05/2016 8:58 AM  Hi Dr. Hyacinth MeekerMiller,  After 2 months of considering medication options to treat my osteoporosis diagnosis, my first treatment choice would be Denosumab (Prolia). I understand this to be a dermal injection 2 times per year. If you are in agreement, would you defend this option with my insurance company and ask your staff to determine my cost? My insurance carrier (Policy#, etc.) has not changed in 2018. As a second option, I would consider Ibandronate (Boniva) or Zoledronic Acid (Reclast); however, these medications, though delivered IV rather than oral, are similar to Fosamax and may not be effective. Your medical opinion and support are always appreciated.  Annabell SabalKimberly Michelotti    Cc: Dr. Hyacinth MeekerMiller; Braxton Feathersebecca Frahm

## 2016-09-07 NOTE — Telephone Encounter (Signed)
Left message to call Noreene LarssonJill at [phone].  Call to update patient from MyChart message.

## 2016-09-15 ENCOUNTER — Telehealth: Payer: Self-pay | Admitting: Obstetrics and Gynecology

## 2016-09-15 NOTE — Telephone Encounter (Signed)
Contacted patients insurance regarding Prolia. No precertification required.   Contacted patients specialty pharmacy and placed order to Dispense 1 60mg /ml injection w/1 refill. Instructions inject 60 mg into the skin once. Administer into upper arm, thigh or abdomen. Instructions per Nolen MuKaitlyn Sprague, RN.   [facility] will contact patient to review any patient responsibility. [facility] will then contact our office to coordinate delivery and nurse will contact patient to schedule injection. [facility] [phone]  Call to patient to review this information. Left voicemail with instructions per most recent DPR.  Routing to triage for review.

## 2016-10-03 ENCOUNTER — Telehealth: Payer: Self-pay

## 2016-10-03 NOTE — Telephone Encounter (Signed)
Erroneous encounter

## 2016-10-03 NOTE — Telephone Encounter (Signed)
Received notification from [facility] that Prolia prior authorization has been approved from 10/02/2016-10/03/2018. Rx has been faxed for Prolia 60 mg/mL inject 60 mg into skin once every 6 months dispense 1 syringe 1RF to [facility] at [phone] with cover sheet and confirmation.  Left message to call Kaitlyn at [phone].

## 2016-10-04 ENCOUNTER — Telehealth: Payer: Self-pay | Admitting: *Deleted

## 2016-10-04 NOTE — Telephone Encounter (Signed)
Opened in error.   Will close encounter.  

## 2016-10-04 NOTE — Telephone Encounter (Signed)
Spoke with patient, advised as seen below per [facility] LPKaitlyn. [facility] states [facility] but has not returned call yet. Advised patient [facility] will be contacting to review patient responsibility and authorize shipment. Advised patient Nurse will then return call to you once [facility] contact our office to coordinate delivery for scheduling injection. Advised patient last calcium level was 06/27/16, may need an updated calcium level prior to injection. Will review with Dr. Hyacinth MeekerMiller and Yvonna AlanisKaitlyn once they return on 3/29 and return call with any additional recommendations. Patient verbalizes understanding and is agreeable.   Dr. Hyacinth MeekerMiller  -last calcium 06/27/16 was normal, does patient need another lab prior to prolia?  Cc: Kaitlyn Sprague

## 2016-10-05 NOTE — Telephone Encounter (Signed)
No repeat lab needed.  Ok to close encounter.

## 2016-10-10 ENCOUNTER — Telehealth: Payer: Self-pay

## 2016-10-10 NOTE — Telephone Encounter (Signed)
Spoke with Mellody Dance at [facility]. Prolia delivery set for 10/11/2016. Address to the office verified. Spoke with patient. Nurse visit for injection scheduled for 10/18/2016 at 3:30 pm. Patient is agreeable to date and time.  Routing to provider for final review. Patient agreeable to disposition. Will close encounter.

## 2016-10-18 ENCOUNTER — Ambulatory Visit (INDEPENDENT_AMBULATORY_CARE_PROVIDER_SITE_OTHER): Payer: BLUE CROSS/BLUE SHIELD

## 2016-10-18 VITALS — BP 100/60 | HR 74 | Resp 12 | Ht 63.75 in | Wt 150.2 lb

## 2016-10-18 DIAGNOSIS — M810 Age-related osteoporosis without current pathological fracture: Secondary | ICD-10-CM

## 2016-10-18 MED ORDER — DENOSUMAB 60 MG/ML ~~LOC~~ SOLN
60.0000 mg | Freq: Once | SUBCUTANEOUS | Status: AC
  Administered 2016-10-18: 60 mg via SUBCUTANEOUS

## 2016-10-18 NOTE — Progress Notes (Signed)
Patient here for 1st Prolia Injection.  Patient tolerated well. Injection given in R arm.   Routed to provider and encounter closed. (SM)

## 2017-02-16 ENCOUNTER — Encounter: Payer: Self-pay | Admitting: Certified Nurse Midwife

## 2017-02-16 ENCOUNTER — Telehealth: Payer: Self-pay

## 2017-02-16 MED ORDER — VALACYCLOVIR HCL 500 MG PO TABS: 500.0000 mg | ORAL_TABLET | Freq: Every day | ORAL | 1 refills | Status: DC

## 2017-02-16 NOTE — Telephone Encounter (Signed)
RF send to pharmacy on file.

## 2017-02-16 NOTE — Telephone Encounter (Signed)
History of HSV 2. Valtrex 500 mg take 1 tablet daily #90 4RF given on 05/19/2015. Last aex on 06/13/2016.   Left message to call Lakeem Rozo at [phone]. How has patient been taking Valtrex? As needed?  Non-Urgent Medical Question  Message [id number]  From Jacqueline Silva To Jacqueline Silva, CNM Sent 02/16/2017 12:55 PM  Would you submit a [facility] [address] [place] [phone])? My Rx prescribed by Ria CommentPatricia Grubb has expired. Thank you-Jacqueline Silva   Responsible Party   Pool - [facility] Clinical Pool No one has taken responsibility for this message.  No actions have been taken on this message.

## 2017-02-16 NOTE — Telephone Encounter (Signed)
Spoke with patient. Patient states that she takes Valrex 500 mg as needed for outbreaks. Has 2 tablets left. Advised will review with Dr.Miller regarding refill. [facility] on file is correct.

## 2017-02-16 NOTE — Telephone Encounter (Signed)
Telephone encounter created to review with patient.

## 2017-03-26 ENCOUNTER — Other Ambulatory Visit: Payer: Self-pay | Admitting: Family Medicine

## 2017-03-26 ENCOUNTER — Ambulatory Visit
Admission: RE | Admit: 2017-03-26 | Discharge: 2017-03-26 | Disposition: A | Discharge status: Home / Self Care | Payer: BLUE CROSS/BLUE SHIELD | Source: Ambulatory Visit | Attending: Family Medicine | Admitting: Family Medicine

## 2017-03-26 DIAGNOSIS — R52 Pain, unspecified: Secondary | ICD-10-CM

## 2017-03-26 DIAGNOSIS — M79642 Pain in left hand: Secondary | ICD-10-CM | POA: Diagnosis not present

## 2017-03-26 DIAGNOSIS — M2550 Pain in unspecified joint: Secondary | ICD-10-CM | POA: Diagnosis not present

## 2017-03-26 DIAGNOSIS — M19041 Primary osteoarthritis, right hand: Secondary | ICD-10-CM | POA: Diagnosis not present

## 2017-04-18 ENCOUNTER — Telehealth: Payer: Self-pay | Admitting: Obstetrics & Gynecology

## 2017-04-18 DIAGNOSIS — M810 Age-related osteoporosis without current pathological fracture: Secondary | ICD-10-CM

## 2017-04-18 NOTE — Telephone Encounter (Signed)
Spoke with patient, advised to keep lab for calcium as scheduled per Dr. Hyacinth Meeker. Patient verbalizes understanding and is agreeable. Will close encounter.

## 2017-04-18 NOTE — Telephone Encounter (Signed)
Spoke with patient. Patient states [facility] and gave consent for prolia to be shipped. Last prolia 10/18/16, due 04/16/17. Advised patient per review of EPIC, [facility] not [facility] to schedule [facility], will check to see if prolia has been received and return call. Advised patient updated calcium level needed before prolia given. Patient scheduled for lab appointment on 04/20/17 at 2:30pm. Patient verbalizes understanding and is agreeable.

## 2017-04-18 NOTE — Telephone Encounter (Signed)
Yes.  Please proceed with scheduling calcium level before prolia injection.  Thanks.

## 2017-04-18 NOTE — Telephone Encounter (Signed)
Call returned to patient, advised Prolia has been delivered to our office. Advised patient would update Dr. Hyacinth Meeker, keep lab appointment for calcium as scheduled, will return call with any additional recommendations. Patient verbalizes understanding and is agreeable.  Dr. Hyacinth Meeker -ok to proceed with scheduled calcium lab prior to prolia?

## 2017-04-18 NOTE — Telephone Encounter (Signed)
Patient wants to schedule her Prolia injection.  States it has been mailed to the office.

## 2017-04-20 ENCOUNTER — Other Ambulatory Visit: Payer: BLUE CROSS/BLUE SHIELD

## 2017-04-20 DIAGNOSIS — M810 Age-related osteoporosis without current pathological fracture: Secondary | ICD-10-CM | POA: Diagnosis not present

## 2017-04-22 LAB — CALCIUM: CALCIUM: 9.4 mg/dL (ref 8.7–10.2)

## 2017-04-23 ENCOUNTER — Telehealth: Payer: Self-pay | Admitting: *Deleted

## 2017-04-23 ENCOUNTER — Encounter: Payer: Self-pay | Admitting: Obstetrics & Gynecology

## 2017-04-23 NOTE — Telephone Encounter (Signed)
[name] message from patient:  Dr. Benson Norway upcoming 2nd Prolia injection, I have attached (1) my annual May 2018 lab results and (2) a Sept. lab results and xray investigating finger joint pain. 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 Unsure if Prolia affecting finger joints-Dr. Shelly Flatten at [facility] requested Sept. labs and xrays. Lab results appear ok and joint pain may be part of my aging/arthritis process. Using Aleve to help pain.     Select Font Size      Jacqueline Silva  04/23/2017  Patient Email  MRN:  [id number]  Description: 56 year old female Provider: Jerene Bears, MD Department: [facility]

## 2017-04-23 NOTE — Telephone Encounter (Signed)
Call to patient regarding [name] message. Left message to call back and ask for triage nurse.

## 2017-04-23 NOTE — Telephone Encounter (Signed)
  Spoke with patient in regard to MyChart message as seen below and lab results. Patient states she just was not sure if Dr. Hyacinth Meeker is aware of diagnosis of arthritis and is unsure if Prolia may effect this? Taking Aleve q other day for joint pain.  Would like Dr. Hyacinth Meeker to review attached labs and Xrays prior to scheduling Prolia.   Advised patient will review with Dr. Hyacinth Meeker and return call with recommendations, patient is agreeable.  Labs attached to MyChart message printed and placed on Dr. Rondel Baton desk for review.   Dr. Hyacinth Meeker -please review and advise?       Notes recorded by Jerene Bears, MD on 04/22/2017 at 10:27 PM EDT Please let pt know this is normal and ok to proceed with Prolia.

## 2017-04-23 NOTE — Telephone Encounter (Signed)
Patient returning call to triage.  Patient request call her at work.

## 2017-05-01 NOTE — Telephone Encounter (Signed)
Patient calling to check the status of scheduling a prolia injection.

## 2017-05-08 NOTE — Telephone Encounter (Signed)
Dr. Hyacinth MeekerMiller -see patient MyChart message below, ok to proceed with scheduling prolia?

## 2017-05-08 NOTE — Telephone Encounter (Signed)
Patient is calling again for the status of her prolia injection .

## 2017-05-09 NOTE — Telephone Encounter (Signed)
As long as her arthritis is just the routine joint inflammation and she is going to be treated with anti-inflammatories, heat, exercise--then prolia is fine.  If her arthritis is auto-immune caused like rheumatoid arthritis, it is fine to use prolia as long as she is not on any immunosuppressants.  If she goes on a immunosuppressant, then she will need to stop Prolia.  I think, right now, she just has regular arthritis (joint inflammation) and there are no immunosuppressants on her medication list.

## 2017-05-09 NOTE — Telephone Encounter (Signed)
Spoke with patient, advised as seen below per Dr. Hyacinth MeekerMiller. Patient states she just has regular arthritis, RA testing negative, no immunosuppressants. Patient would like to proceed with scheduling prolia injection. Scheduled for nurse visit on 05/11/17 at 3pm. Patient thankful for f/u and verbalizes understanding.  Routing to provider for final review. Patient is agreeable to disposition. Will close encounter.

## 2017-05-11 ENCOUNTER — Ambulatory Visit (INDEPENDENT_AMBULATORY_CARE_PROVIDER_SITE_OTHER): Payer: BLUE CROSS/BLUE SHIELD | Admitting: *Deleted

## 2017-05-11 VITALS — BP 108/70 | HR 80 | Resp 16 | Ht 63.75 in | Wt 155.0 lb

## 2017-05-11 DIAGNOSIS — M810 Age-related osteoporosis without current pathological fracture: Secondary | ICD-10-CM | POA: Diagnosis not present

## 2017-05-11 MED ORDER — DENOSUMAB 60 MG/ML ~~LOC~~ SOLN
60.0000 mg | Freq: Once | SUBCUTANEOUS | Status: AC
  Administered 2017-05-11: 60 mg via SUBCUTANEOUS

## 2017-05-11 NOTE — Progress Notes (Signed)
Patient in today for prolia injection. Given in the Left arm. She denies any reaction with previous injection. Patient tolerated well.

## 2017-06-18 ENCOUNTER — Telehealth: Payer: Self-pay | Admitting: Certified Nurse Midwife

## 2017-06-18 NOTE — Telephone Encounter (Signed)
Aex/rd  06/18/17 spoke with pt re: dr cx due to inclement weather/[place]  [facility].

## 2017-06-19 ENCOUNTER — Ambulatory Visit: Payer: BLUE CROSS/BLUE SHIELD | Admitting: Certified Nurse Midwife

## 2017-06-19 NOTE — Telephone Encounter (Signed)
Left message for patient to call & schedule aex. 

## 2017-06-21 DIAGNOSIS — Z1231 Encounter for screening mammogram for malignant neoplasm of breast: Secondary | ICD-10-CM | POA: Diagnosis not present

## 2017-06-25 NOTE — Telephone Encounter (Signed)
aex is 07-13-17

## 2017-06-29 ENCOUNTER — Encounter: Payer: Self-pay | Admitting: Obstetrics & Gynecology

## 2017-07-12 NOTE — Progress Notes (Signed)
57 y.o. G1P1001 MarriedCaucasianF here for annual exam.  Doing well except for some arthritis on her hands.  Had some testing for inflammation.    Took Prolia in March and October of 2018.  Will plan mammogram in November with MMG.    Had a root canal on Wednesday.  On Clindamycin.    Patient's last menstrual period was 01/26/2013.          Sexually active: Yes.    The current method of family planning is post menopausal status.    Exercising: Yes.    walking Smoker:  no  Health Maintenance: Pap:  05/19/15 Neg. HR HPV:neg   03/09/14 Neg  History of abnormal Pap:  Yes, remote hx MMG:  06/21/17 BIRADS2:Benign  Colonoscopy:  2010 f/u 10 years  BMD:   06/07/16 Osteoporosis  TDaP:  2017 Pneumonia vaccine(s):  No Shingrix: N/A Hep C testing: 05/19/15 Neg  Screening Labs: At work    reports that  has never smoked. she has never used smokeless tobacco. She reports that she drinks about 0.6 oz of alcohol per week. She reports that she does not use drugs.  Past Medical History:  Diagnosis Date  . ASCUS on Pap smear 1/97  . Hemorrhoid   . HSV-2 (herpes simplex virus 2) infection 4/05  . Osteoporosis 9/13  . Vitamin D deficiency     Past Surgical History:  Procedure Laterality Date  . BREAST BIOPSY  1986  . ESOPHAGOGASTRODUODENOSCOPY     Dr. Loreta AveMann    Current Outpatient Medications  Medication Sig Dispense Refill  . Aspirin-Acetaminophen-Caffeine (EXCEDRIN MIGRAINE PO) Take by mouth as needed.    . clindamycin (CLEOCIN) 150 MG capsule Take 1 capsule by mouth 3 (three) times daily.  0  . conjugated estrogens (PREMARIN) vaginal cream 1/2 gram vaginally twice weekly 42.5 g 4  . denosumab (PROLIA) 60 MG/ML SOLN injection every 6 (six) months.    Marland Kitchen. HYDROcodone-acetaminophen (NORCO) 10-325 MG tablet as needed.  0  . Naproxen Sodium (ALEVE PO) Take by mouth as needed.    . valACYclovir (VALTREX) 500 MG tablet Take 1 tablet (500 mg total) by mouth daily. 1 tab po bid x 3 days with symptoms.   Then take daily for suppression. 30 tablet 1   No current facility-administered medications for this visit.     History reviewed. No pertinent family history.  ROS:  Pertinent items are noted in HPI.  Otherwise, a comprehensive ROS was negative.  Exam:   BP 96/66 (BP Location: Right Arm, Patient Position: Sitting, Cuff Size: Normal)   Pulse 68   Resp 16   Ht 5' 3.75" (1.619 m)   Wt 153 lb (69.4 kg)   LMP 01/26/2013   BMI 26.47 kg/m   Weight change: +3#    Height: 5' 3.75" (161.9 cm)  Ht Readings from Last 3 Encounters:  07/13/17 5' 3.75" (1.619 m)  05/11/17 5' 3.75" (1.619 m)  10/18/16 5' 3.75" (1.619 m)    General appearance: alert, cooperative and appears stated age Head: Normocephalic, without obvious abnormality, atraumatic Neck: no adenopathy, supple, symmetrical, trachea midline and thyroid normal to inspection and palpation Lungs: clear to auscultation bilaterally Breasts: normal appearance, no masses or tenderness Heart: regular rate and rhythm Abdomen: soft, non-tender; bowel sounds normal; no masses,  no organomegaly Extremities: extremities normal, atraumatic, no cyanosis or edema Skin: Skin color, texture, turgor normal. No rashes or lesions Lymph nodes: Cervical, supraclavicular, and axillary nodes normal. No abnormal inguinal nodes palpated Neurologic: Grossly normal  Pelvic: External genitalia:  no lesions              Urethra:  normal appearing urethra with no masses, tenderness or lesions              Bartholins and Skenes: normal                 Vagina: normal appearing vagina with normal color and discharge, no lesions              Cervix: no lesions              Pap taken: No. Bimanual Exam:  Uterus:  normal size, contour, position, consistency, mobility, non-tender              Adnexa: normal adnexa and no mass, fullness, tenderness               Rectovaginal: Confirms               Anus:  normal sphincter tone, no lesions  Chaperone was present  for exam.  A:  Well Woman with normal exam PMP, no HRT Vaginal atrophic changes H/O recurrent UTIs Osteoporosis on Fosamax H/O HSV without recent outbreaks  P:   Mammogram guidelines reviewed.  Doing 3D. pap smear with neg HR HPV 11/16.  No pap smear obtained today No RF needed right for Valtrex, Premarin, macrobid On Prolia.  Repeat BMD with MMG 12/18. Return annually or prn

## 2017-07-13 ENCOUNTER — Other Ambulatory Visit: Payer: Self-pay

## 2017-07-13 ENCOUNTER — Ambulatory Visit: Payer: BLUE CROSS/BLUE SHIELD | Admitting: Obstetrics & Gynecology

## 2017-07-13 ENCOUNTER — Encounter: Payer: Self-pay | Admitting: Obstetrics & Gynecology

## 2017-07-13 VITALS — BP 96/66 | HR 68 | Resp 16 | Ht 63.75 in | Wt 153.0 lb

## 2017-07-13 DIAGNOSIS — Z01419 Encounter for gynecological examination (general) (routine) without abnormal findings: Secondary | ICD-10-CM | POA: Diagnosis not present

## 2017-10-08 ENCOUNTER — Telehealth: Payer: Self-pay | Admitting: Obstetrics & Gynecology

## 2017-10-08 ENCOUNTER — Encounter: Payer: Self-pay | Admitting: Obstetrics & Gynecology

## 2017-10-08 NOTE — Telephone Encounter (Signed)
Call to patient regarding [facility] Chart message. Call drop before any assessment and unable to reach patient at second attempt. Left message to call back and speak to triage nurse.   Last note regarding DiIflucan was 02/2013.

## 2017-10-08 NOTE — Telephone Encounter (Signed)
Spoke with patient. Reports uncontrollable vaginal itching 1 wk after intercourse, is unsure if related. Denies any other symptoms. Had a previous Rx of diflucan from 2014, took on 3/30, provided some relief. Itching has improved, feels "tingly".   Recommended OV for further evaluation, scheduled for 10/09/17 at 10am with Dr. Hyacinth MeekerMiller. Advised may apply coconut oil externally for itching relief. Patient verbalizes understanding.   Last AEX 07/13/17 Dr. Hyacinth MeekerMiller.  Routing to provider for final review. Patient is agreeable to disposition. Will close encounter.

## 2017-10-08 NOTE — Telephone Encounter (Signed)
Message   ----- Message from Mychart, Generic sent at 10/08/2017 9:25 AM EDT -----    Hi Dr. Hyacinth MeekerMiller- Requesting an Rx for Fluconazole 150 mg tablet. Vaginal itching about 1 week following sex. Relief after taking my last tablet of Fluconazole. Unsure if related to sex (few & far between) but would like to be prepared if occurs again. Can also recommend an OTC treatment. [address], [address] [place], [place] [phone]). Thanks! ~Anni

## 2017-10-09 ENCOUNTER — Encounter: Payer: Self-pay | Admitting: Obstetrics & Gynecology

## 2017-10-09 ENCOUNTER — Other Ambulatory Visit: Payer: Self-pay

## 2017-10-09 ENCOUNTER — Ambulatory Visit (INDEPENDENT_AMBULATORY_CARE_PROVIDER_SITE_OTHER): Payer: BLUE CROSS/BLUE SHIELD | Admitting: Obstetrics & Gynecology

## 2017-10-09 VITALS — BP 98/70 | HR 92 | Resp 16 | Ht 63.75 in | Wt 159.0 lb

## 2017-10-09 DIAGNOSIS — N898 Other specified noninflammatory disorders of vagina: Secondary | ICD-10-CM

## 2017-10-09 DIAGNOSIS — R300 Dysuria: Secondary | ICD-10-CM

## 2017-10-09 LAB — POCT URINALYSIS DIPSTICK
BILIRUBIN UA: NEGATIVE
Blood, UA: NEGATIVE
Glucose, UA: NEGATIVE
KETONES UA: NEGATIVE
Nitrite, UA: NEGATIVE
Protein, UA: NEGATIVE
Urobilinogen, UA: 0.2 E.U./dL
pH, UA: 5 (ref 5.0–8.0)

## 2017-10-09 MED ORDER — TERCONAZOLE 0.4 % VA CREA: 1.0000 | TOPICAL_CREAM | Freq: Every day | VAGINAL | 0 refills | Status: DC

## 2017-10-09 MED ORDER — DENOSUMAB 60 MG/ML ~~LOC~~ SOLN: 60.0000 mg | SUBCUTANEOUS | 1 refills | Status: DC

## 2017-10-09 NOTE — Progress Notes (Signed)
GYNECOLOGY  VISIT  CC:   Vaginal and vulvar itching x 7 days  HPI: 57 y.o. 731P1001 Married Caucasian female here for vaginal and vulvar itching x 1 week.  This started after intercourse about a week ago.  Took an oral macrobid x 2.  She uses this typically after intercourse for recurrent UTI  On Sunday, itching was so severe that she was rubbing herself.  She had some left over fluconazole and this actually gave her some relief.   Denies vaginal discharge and itching.    Used vaginal premarin on Sunday.  Is using this twice weekly.    Last pap and HR HRPV was 11/16 and neg/neg.  GYNECOLOGIC HISTORY: Patient's last menstrual period was 01/26/2013. Contraception: post menopausal  Menopausal hormone therapy: none  Patient Active Problem List   Diagnosis Date Noted  . Osteoporosis 07/08/2014    Past Medical History:  Diagnosis Date  . ASCUS on Pap smear 1/97  . Hemorrhoid   . HSV-2 (herpes simplex virus 2) infection 4/05  . Osteoporosis 9/13  . Vitamin D deficiency     Past Surgical History:  Procedure Laterality Date  . BREAST BIOPSY  1986  . ESOPHAGOGASTRODUODENOSCOPY     Dr. Loreta AveMann    MEDS:   Current Outpatient Medications on File Prior to Visit  Medication Sig Dispense Refill  . Aspirin-Acetaminophen-Caffeine (EXCEDRIN MIGRAINE PO) Take by mouth as needed.    . conjugated estrogens (PREMARIN) vaginal cream 1/2 gram vaginally twice weekly 42.5 g 4  . denosumab (PROLIA) 60 MG/ML SOLN injection every 6 (six) months.    . Naproxen Sodium (ALEVE PO) Take by mouth as needed.    . valACYclovir (VALTREX) 500 MG tablet Take 1 tablet (500 mg total) by mouth daily. 1 tab po bid x 3 days with symptoms.  Then take daily for suppression. 30 tablet 1   No current facility-administered medications on file prior to visit.     ALLERGIES: Patient has no known allergies.  History reviewed. No pertinent family history.  SH:  Married, non smoker  Review of Systems  Genitourinary:  Positive for urgency.       Vulvar itching Loss of sexual interest Pain with intercourse   All other systems reviewed and are negative.   PHYSICAL EXAMINATION:    BP 98/70 (BP Location: Right Arm, Patient Position: Sitting, Cuff Size: Normal)   Pulse 92   Resp 16   Ht 5' 3.75" (1.619 m)   Wt 159 lb (72.1 kg)   LMP 01/26/2013   BMI 27.51 kg/m     Physical Exam  Constitutional: She is oriented to person, place, and time. She appears well-developed and well-nourished.  Genitourinary:    There is tenderness in the vagina.  Lymphadenopathy:       Right: No inguinal adenopathy present.       Left: No inguinal adenopathy present.  Neurological: She is alert and oriented to person, place, and time.  Skin: Skin is warm and dry.  Psychiatric: She has a normal mood and affect.    Chaperone was present for exam.  Assessment: Vaginal and vulvar irritation/erythema, itching that is improved with an oral fluconazole  Plan: Affirm pending Since still symptomatic, will treat with Terazol 7 vaginal and externally x 7 days.

## 2017-10-09 NOTE — Progress Notes (Signed)
Spoke with patient. Patient is due for Prolia after Nov 07, 2017. New order for Prolia 60 mg/ml inject into skin every 6 months #1 1RF sent to [facility]. PA is good through 10/03/2018. Patient is aware she will receive a call from [facility] to discuss cost and shipment authorization. Once medication is received in the office she will be contacted to schedule a nurse visit for injection administration.

## 2017-10-10 LAB — VAGINITIS/VAGINOSIS, DNA PROBE
CANDIDA SPECIES: NEGATIVE
Gardnerella vaginalis: NEGATIVE
Trichomonas vaginosis: NEGATIVE

## 2017-10-15 ENCOUNTER — Telehealth: Payer: Self-pay | Admitting: *Deleted

## 2017-10-15 NOTE — Telephone Encounter (Signed)
-----   Message from Jerene BearsMary S Miller, MD sent at 10/15/2017  8:53 AM EDT ----- Can you please let pt know her vaginitis testing was negative.  She is having irritation and mild urinary urgency.  Is using premarin vaginal cream.  If the Terazol didn't help, I think should make sure she is using the premarin really regularly or switch to estrace and use at least twice weekly for another two months and then follow-up.  I think her symptoms are lack of estrogen related.  Thanks.  CC:  Gara Kronereina Morales

## 2017-10-15 NOTE — Telephone Encounter (Signed)
Notes recorded by Leda MinHamm, Greycen Felter N, RN on 10/15/2017 at 10:46 AM EDT Left message to call Noreene LarssonJill at [phone].

## 2017-10-18 NOTE — Telephone Encounter (Signed)
Patient returned the call to nurse Noreene LarssonJill. She said she is very busy at a conference this week and requests the details of her results get left on her voicemail. She said she'll call back tomorrow afternoon if she has any questions after that.

## 2017-10-18 NOTE — Telephone Encounter (Signed)
Left detailed message, advised as seen below per Dr. Hyacinth MeekerMiller. Advised to return call to office to give update on symptoms and to confirm premarin use. [facility] [place] [facility] [phone].

## 2017-10-30 NOTE — Telephone Encounter (Signed)
[facility] is calling to confirm shipment of Prolia to the office. The Prolia will be deliverd 10/31/17.

## 2017-10-30 NOTE — Telephone Encounter (Signed)
Left message to call Noreene Larsson at [phone].   Per review of Epic, last prolia received on 05/11/17, due on or after 11/07/2017.

## 2017-10-31 NOTE — Telephone Encounter (Signed)
Routing to Dr. Miller, will close encounter.  

## 2017-10-31 NOTE — Telephone Encounter (Signed)
Patient returned call and scheduled an appointment on 11/08/17 for a Prolia injection.

## 2017-11-08 ENCOUNTER — Ambulatory Visit (INDEPENDENT_AMBULATORY_CARE_PROVIDER_SITE_OTHER): Payer: BLUE CROSS/BLUE SHIELD

## 2017-11-08 VITALS — BP 108/64 | HR 70 | Ht 63.75 in | Wt 159.0 lb

## 2017-11-08 DIAGNOSIS — M810 Age-related osteoporosis without current pathological fracture: Secondary | ICD-10-CM

## 2017-11-08 MED ORDER — DENOSUMAB 60 MG/ML ~~LOC~~ SOSY
60.0000 mg | PREFILLED_SYRINGE | Freq: Once | SUBCUTANEOUS | Status: AC
  Administered 2017-11-08: 60 mg via SUBCUTANEOUS

## 2017-11-08 NOTE — Progress Notes (Signed)
Patient in today for Prolia injection. Given in right arm. She denies any reaction with previous injection. Patient tolerated injection well.

## 2017-12-17 DIAGNOSIS — L570 Actinic keratosis: Secondary | ICD-10-CM | POA: Diagnosis not present

## 2017-12-17 DIAGNOSIS — D2262 Melanocytic nevi of left upper limb, including shoulder: Secondary | ICD-10-CM | POA: Diagnosis not present

## 2017-12-17 DIAGNOSIS — D2261 Melanocytic nevi of right upper limb, including shoulder: Secondary | ICD-10-CM | POA: Diagnosis not present

## 2017-12-17 DIAGNOSIS — L821 Other seborrheic keratosis: Secondary | ICD-10-CM | POA: Diagnosis not present

## 2018-01-14 ENCOUNTER — Other Ambulatory Visit: Payer: Self-pay | Admitting: Obstetrics & Gynecology

## 2018-01-14 NOTE — Telephone Encounter (Signed)
Medication refill request: Valtrex 500 mg  Last AEX:  07/13/17 Next AEX: 07/16/18 Last MMG (if hormonal medication request): 06/21/17 Bi rads Category 2 Benign Refill authorized: Please refill if appropriate.

## 2018-04-11 ENCOUNTER — Encounter: Payer: Self-pay | Admitting: Obstetrics & Gynecology

## 2018-04-11 ENCOUNTER — Ambulatory Visit: Payer: BLUE CROSS/BLUE SHIELD | Admitting: Obstetrics & Gynecology

## 2018-04-11 VITALS — BP 110/70 | HR 76 | Resp 18 | Ht 63.75 in | Wt 143.2 lb

## 2018-04-11 DIAGNOSIS — M810 Age-related osteoporosis without current pathological fracture: Secondary | ICD-10-CM

## 2018-04-11 DIAGNOSIS — R300 Dysuria: Secondary | ICD-10-CM

## 2018-04-11 DIAGNOSIS — N952 Postmenopausal atrophic vaginitis: Secondary | ICD-10-CM

## 2018-04-11 DIAGNOSIS — N390 Urinary tract infection, site not specified: Secondary | ICD-10-CM | POA: Diagnosis not present

## 2018-04-11 DIAGNOSIS — N898 Other specified noninflammatory disorders of vagina: Secondary | ICD-10-CM

## 2018-04-11 LAB — POCT URINALYSIS DIPSTICK
Bilirubin, UA: NEGATIVE
Glucose, UA: NEGATIVE
KETONES UA: NEGATIVE
NITRITE UA: NEGATIVE
PH UA: 5 (ref 5.0–8.0)
Protein, UA: NEGATIVE
UROBILINOGEN UA: 0.2 U/dL

## 2018-04-11 MED ORDER — SULFAMETHOXAZOLE-TRIMETHOPRIM 800-160 MG PO TABS: 1.0000 | ORAL_TABLET | Freq: Two times a day (BID) | ORAL | 0 refills | Status: DC

## 2018-04-11 MED ORDER — NITROFURANTOIN MONOHYD MACRO 100 MG PO CAPS: ORAL_CAPSULE | ORAL | 1 refills | Status: DC

## 2018-04-11 MED ORDER — ESTROGENS, CONJUGATED 0.625 MG/GM VA CREA: TOPICAL_CREAM | VAGINAL | 4 refills | Status: DC

## 2018-04-11 NOTE — Progress Notes (Signed)
GYNECOLOGY  VISIT  CC:   dysuria  HPI: 57 y.o. G36P1001 Married White or Caucasian female here for urinary urgency started today.  Had intercourse Saturday night.  Voided afterwards and took macrobid on Sunday morning.  Reports she's had a little back ache all week.  She started feeling urinary urgency this morning.  Denies fever.  Urine is cloudy.  Denies blood in urine.  Having some increased vaginal odor.    Going on vacation on Saturday.    Has been prescribed vaginal estrogen cream.  She admits she is not using this as regularly as she should but with these current symptoms, is more motivated to try and be more faithful with use.  GYNECOLOGIC HISTORY: Patient's last menstrual period was 01/26/2013. Contraception: post menopausal  Menopausal hormone therapy: premarin vag cream   Patient Active Problem List   Diagnosis Date Noted  . Osteoporosis 07/08/2014    Past Medical History:  Diagnosis Date  . ASCUS on Pap smear 1/97  . Hemorrhoid   . HSV-2 (herpes simplex virus 2) infection 4/05  . Osteoporosis 9/13  . Vitamin D deficiency     Past Surgical History:  Procedure Laterality Date  . BREAST BIOPSY  1986  . ESOPHAGOGASTRODUODENOSCOPY     Dr. Loreta Ave    MEDS:   Current Outpatient Medications on File Prior to Visit  Medication Sig Dispense Refill  . Aspirin-Acetaminophen-Caffeine (EXCEDRIN MIGRAINE PO) Take by mouth as needed.    . conjugated estrogens (PREMARIN) vaginal cream 1/2 gram vaginally twice weekly 42.5 g 4  . denosumab (PROLIA) 60 MG/ML SOLN injection Inject 60 mg into the skin every 6 (six) months. 1 Syringe 1  . Naproxen Sodium (ALEVE PO) Take by mouth as needed.    . valACYclovir (VALTREX) 500 MG tablet TAKE 1 TABLET BY MOUTH TWICE DAILY FOR 3 DAYS WITH SYMPTOMS THEN EVERY DAY FOR SUPPRESSION 30 tablet 2  . escitalopram (LEXAPRO) 5 MG tablet Take 1 tablet by mouth daily.  1   No current facility-administered medications on file prior to visit.      ALLERGIES: Patient has no known allergies.  History reviewed. No pertinent family history.  SH:  Married, non smoker  Review of Systems  Genitourinary: Positive for dyspareunia, dysuria and urgency.       Loss of sexual interest   All other systems reviewed and are negative.   PHYSICAL EXAMINATION:    BP 110/70 (BP Location: Right Arm, Patient Position: Sitting, Cuff Size: Normal)   Pulse 76   Resp 18   Ht 5' 3.75" (1.619 m)   Wt 143 lb 3.2 oz (65 kg)   LMP 01/26/2013   BMI 24.77 kg/m     General appearance: alert, cooperative and appears stated age Flank:  No CVA tenderness Abdomen: soft, +suprapubic tenderness, bowel sounds normal; no masses,  no organomegaly Lymph:  no inguinal LAD noted  Pelvic: External genitalia:  no lesions              Urethra:  normal appearing urethra with no masses, tenderness or lesions              Bartholins and Skenes: normal                 Vagina: normal appearing vagina with normal color and discharge, no lesions              Cervix: no lesions              Bimanual Exam:  Uterus:  normal size, contour, position, consistency, mobility, non-tender              Adnexa: no mass, fullness, tenderness  Chaperone was present for exam.  Assessment: Cystitis Needs calcium level drawn today Vaginal odor  Plan: Affirm obtained Urine culture and micro pending Bactrim DS bid x 3 days.  Then she will use macrobid 100mg  post coitally for prophylaxis Rx for premarin vaginal cream 1/2 gram pv twice weekly to pharmacy. Calcium level will be obtained today as well.

## 2018-04-12 LAB — URINALYSIS, MICROSCOPIC ONLY
CASTS: NONE SEEN /LPF
WBC, UA: >30 /hpf — AB (ref 0–5)

## 2018-04-12 LAB — VAGINITIS/VAGINOSIS, DNA PROBE
Candida Species: NEGATIVE
Gardnerella vaginalis: NEGATIVE
Trichomonas vaginosis: NEGATIVE

## 2018-04-12 LAB — CALCIUM: Calcium: 9.3 mg/dL (ref 8.7–10.2)

## 2018-04-13 LAB — URINE CULTURE

## 2018-04-22 ENCOUNTER — Ambulatory Visit: Payer: BLUE CROSS/BLUE SHIELD

## 2018-04-22 DIAGNOSIS — R300 Dysuria: Secondary | ICD-10-CM

## 2018-04-22 DIAGNOSIS — Z87448 Personal history of other diseases of urinary system: Secondary | ICD-10-CM

## 2018-04-23 LAB — URINALYSIS, MICROSCOPIC ONLY
Bacteria, UA: NONE SEEN
CASTS: NONE SEEN /LPF
Epithelial Cells (non renal): NONE SEEN /hpf (ref 0–10)
RBC, UA: NONE SEEN /hpf (ref 0–2)

## 2018-05-07 ENCOUNTER — Telehealth: Payer: Self-pay | Admitting: *Deleted

## 2018-05-07 NOTE — Telephone Encounter (Signed)
Left detailed message, ok per dpr. Advised Prolia Rx has been delivered to [facility], please return call to office to schedule nurse visit for Prolia Injection.     Prolia Rx received in office.  Last Prolia injection received 11/08/17 Next Prolia due on or after 05/07/18.  Schedule Nurse visit

## 2018-05-09 ENCOUNTER — Ambulatory Visit (INDEPENDENT_AMBULATORY_CARE_PROVIDER_SITE_OTHER): Payer: BLUE CROSS/BLUE SHIELD

## 2018-05-09 VITALS — BP 110/64 | HR 72 | Resp 14 | Ht 63.75 in | Wt 144.0 lb

## 2018-05-09 DIAGNOSIS — M810 Age-related osteoporosis without current pathological fracture: Secondary | ICD-10-CM

## 2018-05-09 MED ORDER — DENOSUMAB 60 MG/ML ~~LOC~~ SOSY
60.0000 mg | PREFILLED_SYRINGE | Freq: Once | SUBCUTANEOUS | Status: AC
  Administered 2018-05-09: 60 mg via SUBCUTANEOUS

## 2018-05-09 NOTE — Telephone Encounter (Signed)
Routing to Dr. Miller.   Encounter closed.  

## 2018-05-09 NOTE — Telephone Encounter (Signed)
Patient returned call. Scheduled today at 2:00 for injection.

## 2018-05-09 NOTE — Progress Notes (Signed)
Patient in today for fourth Prolia injection. Patient's initial calcium level was obtained on 04/11/18 Result: 9.3  Last AEX: 07/13/17  Last BMD: 06/07/16 -3.1   Injection given in right arm.  Patient tolerated injection well.  Routed to provider for review.

## 2018-06-24 ENCOUNTER — Encounter: Payer: Self-pay | Admitting: Obstetrics & Gynecology

## 2018-06-24 DIAGNOSIS — M8589 Other specified disorders of bone density and structure, multiple sites: Secondary | ICD-10-CM | POA: Diagnosis not present

## 2018-06-24 DIAGNOSIS — Z1231 Encounter for screening mammogram for malignant neoplasm of breast: Secondary | ICD-10-CM | POA: Diagnosis not present

## 2018-07-09 ENCOUNTER — Telehealth: Payer: Self-pay

## 2018-07-09 NOTE — Telephone Encounter (Signed)
Called patient and informed her of BMD results per Dr. Hyacinth MeekerMiller.   " hips and spine are a little worse, but not osteoporosis yet. Recheck in 2 years."   Patient verbalized understanding.

## 2018-07-16 ENCOUNTER — Other Ambulatory Visit (HOSPITAL_COMMUNITY)
Admission: RE | Admit: 2018-07-16 | Discharge: 2018-07-16 | Disposition: A | Discharge status: Home / Self Care | Payer: BLUE CROSS/BLUE SHIELD | Source: Ambulatory Visit | Attending: Obstetrics & Gynecology | Admitting: Obstetrics & Gynecology

## 2018-07-16 ENCOUNTER — Ambulatory Visit: Payer: BLUE CROSS/BLUE SHIELD | Admitting: Obstetrics & Gynecology

## 2018-07-16 ENCOUNTER — Other Ambulatory Visit: Payer: Self-pay

## 2018-07-16 ENCOUNTER — Encounter: Payer: Self-pay | Admitting: Obstetrics & Gynecology

## 2018-07-16 VITALS — BP 90/60 | HR 68 | Resp 16 | Ht 63.5 in | Wt 143.0 lb

## 2018-07-16 DIAGNOSIS — Z124 Encounter for screening for malignant neoplasm of cervix: Secondary | ICD-10-CM

## 2018-07-16 DIAGNOSIS — Z01419 Encounter for gynecological examination (general) (routine) without abnormal findings: Secondary | ICD-10-CM | POA: Diagnosis not present

## 2018-07-16 NOTE — Patient Instructions (Signed)
[facility] Phone: [phone] Location: Lower level of [facility] ([address]) Hours: 7:30 a.m. to 6:00 p.m., Monday through Friday.   [facility] Phone: [phone] Location: [address] Hours: 7:30 a.m. to 6:00 p.m., Monday through Friday.  

## 2018-07-16 NOTE — Progress Notes (Signed)
58 y.o. G13P1001 Married White or Caucasian female here for annual exam.  D/w pt BMD results.  Spine measurement was improved for -3.1 to -2.1.  We decided today to stop the prolia and transition to Fosamax again.  Repeat BMD in 2 years.   Pt aware colonoscopy is due this year.    Patient's last menstrual period was 01/26/2013.          Sexually active: Yes.    The current method of family planning is post menopausal status.    Exercising: Yes.    walking, elliptical  Smoker:  no  Health Maintenance: Pap:  05/19/15 neg. HR HPV:neg   03/09/14 neg  History of abnormal Pap:  yes MMG:  06/24/18 BIRADS2:Benign  Colonoscopy:  2010 f/u 10 years.  This is due this year.  Done with Dr. Loreta Ave BMD:   06/07/16 Osteoporosis  TDaP:  2017 Pneumonia vaccine(s):  n/a Shingrix:   No Hep C testing: 05/19/15 Neg  Screening Labs: at work    reports that she has never smoked. She has never used smokeless tobacco. She reports current alcohol use of about 1.0 standard drinks of alcohol per week. She reports that she does not use drugs.  Past Medical History:  Diagnosis Date  . ASCUS on Pap smear 1/97  . Hemorrhoid   . HSV-2 (herpes simplex virus 2) infection 4/05  . Osteoporosis 9/13  . Vitamin D deficiency     Past Surgical History:  Procedure Laterality Date  . BREAST BIOPSY  1986  . ESOPHAGOGASTRODUODENOSCOPY     Dr. Loreta Ave    Current Outpatient Medications  Medication Sig Dispense Refill  . Aspirin-Acetaminophen-Caffeine (EXCEDRIN MIGRAINE PO) Take by mouth as needed.    . conjugated estrogens (PREMARIN) vaginal cream 1/2 gram vaginally twice weekly 42.5 g 4  . denosumab (PROLIA) 60 MG/ML SOLN injection Inject 60 mg into the skin every 6 (six) months. 1 Syringe 1  . Naproxen Sodium (ALEVE PO) Take by mouth as needed.    . nitrofurantoin, macrocrystal-monohydrate, (MACROBID) 100 MG capsule Take one tablet orally after intercourse. 30 capsule 1  . valACYclovir (VALTREX) 500 MG tablet TAKE 1 TABLET  BY MOUTH TWICE DAILY FOR 3 DAYS WITH SYMPTOMS THEN EVERY DAY FOR SUPPRESSION 30 tablet 2   No current facility-administered medications for this visit.     History reviewed. No pertinent family history.  Review of Systems  All other systems reviewed and are negative.   Exam:   BP 90/60 (BP Location: Right Arm, Patient Position: Sitting, Cuff Size: Normal)   Pulse 68   Resp 16   Ht 5' 3.5" (1.613 m)   Wt 143 lb (64.9 kg)   LMP 01/26/2013   BMI 24.93 kg/m    Height: 5' 3.5" (161.3 cm)  Ht Readings from Last 3 Encounters:  07/16/18 5' 3.5" (1.613 m)  05/09/18 5' 3.75" (1.619 m)  04/11/18 5' 3.75" (1.619 m)   General appearance: alert, cooperative and appears stated age Head: Normocephalic, without obvious abnormality, atraumatic Neck: no adenopathy, supple, symmetrical, trachea midline and thyroid normal to inspection and palpation Lungs: clear to auscultation bilaterally Breasts: normal appearance, no masses or tenderness Heart: regular rate and rhythm Abdomen: soft, non-tender; bowel sounds normal; no masses,  no organomegaly Extremities: extremities normal, atraumatic, no cyanosis or edema Skin: Skin color, texture, turgor normal. No rashes or lesions Lymph nodes: Cervical, supraclavicular, and axillary nodes normal. No abnormal inguinal nodes palpated Neurologic: Grossly normal   Pelvic: External genitalia:  no lesions  Urethra:  normal appearing urethra with no masses, tenderness or lesions              Bartholins and Skenes: normal                 Vagina: normal appearing vagina with normal color and discharge, no lesions              Cervix: no lesions              Pap taken: Yes.   Bimanual Exam:  Uterus:  normal size, contour, position, consistency, mobility, non-tender              Adnexa: normal adnexa and no mass, fullness, tenderness               Rectovaginal: Confirms               Anus:  normal sphincter tone, no lesions  Chaperone was  present for exam.  A:  Well Woman with normal exam PMP, no HRT Vaginal atrophic changes Osteoporosis in spine that has improved to osteopenia with most recent BMD H/o HSV (no recent outbreaks)  P:   Mammogram guidelines reviewed.  Doing 3D MMG.   pap smear with HR HPV obtained today Will transition back to fosamax in May (6 month after last Prolia injection) Lab work done with work.  Pt brought copies and results will be faxed into Epic BMD will be repeated in two years No RFs needed for Valtrex, Premarin, Macrobid Return annually or prn

## 2018-07-19 LAB — CYTOLOGY - PAP
Diagnosis: NEGATIVE
HPV: NOT DETECTED

## 2018-07-22 ENCOUNTER — Encounter: Payer: Self-pay | Admitting: Obstetrics & Gynecology

## 2018-11-26 ENCOUNTER — Telehealth: Payer: Self-pay | Admitting: Obstetrics & Gynecology

## 2018-11-26 ENCOUNTER — Encounter: Payer: Self-pay | Admitting: Obstetrics & Gynecology

## 2018-11-26 NOTE — Telephone Encounter (Signed)
Patient sent the following correspondence through MyChart. Routing to triage to assist patient with request.  Hi Dr. Hyacinth Meeker, [facility] you, your family and your staff are well. Per my last exam with you, we decided to return to Fosamax (alendronate sodium). [facility] and [facility] RX: 84 day - twelve - 70 mg tablets. If we need to discuss, please call me at [phone].  Stay safe!  Jacqueline Silva

## 2018-11-27 MED ORDER — ALENDRONATE SODIUM 70 MG PO TABS: 70.0000 mg | ORAL_TABLET | ORAL | 3 refills | Status: DC

## 2018-11-27 NOTE — Telephone Encounter (Signed)
Last AEX 07/16/18 -discussed transitioning to Fosamax in May, 6 mo after last Prolia inj.   Rx pended for Fosamax 70 mg tab PO q7 days. #12/3RF Pharmacy updated.   Routing to Dr. Hyacinth Meeker to review RX.

## 2019-01-22 DIAGNOSIS — D225 Melanocytic nevi of trunk: Secondary | ICD-10-CM | POA: Diagnosis not present

## 2019-01-22 DIAGNOSIS — L570 Actinic keratosis: Secondary | ICD-10-CM | POA: Diagnosis not present

## 2019-02-05 ENCOUNTER — Encounter: Payer: Self-pay | Admitting: Obstetrics & Gynecology

## 2019-02-13 DIAGNOSIS — Z1211 Encounter for screening for malignant neoplasm of colon: Secondary | ICD-10-CM | POA: Diagnosis not present

## 2019-02-13 DIAGNOSIS — K219 Gastro-esophageal reflux disease without esophagitis: Secondary | ICD-10-CM | POA: Diagnosis not present

## 2019-02-28 ENCOUNTER — Other Ambulatory Visit: Payer: Self-pay | Admitting: Obstetrics & Gynecology

## 2019-02-28 NOTE — Telephone Encounter (Signed)
Medication refill request: Valtrex  Last AEX:  07-16-2018 SM  Next AEX: 11-20-19  Last MMG (if hormonal medication request): n/a Refill authorized: Today, please advise.   Medication pended for #30, 2RF. Please refill if appropriate.

## 2019-03-19 DIAGNOSIS — K3189 Other diseases of stomach and duodenum: Secondary | ICD-10-CM | POA: Diagnosis not present

## 2019-03-19 DIAGNOSIS — Z1211 Encounter for screening for malignant neoplasm of colon: Secondary | ICD-10-CM | POA: Diagnosis not present

## 2019-03-19 DIAGNOSIS — K2950 Unspecified chronic gastritis without bleeding: Secondary | ICD-10-CM | POA: Diagnosis not present

## 2019-03-19 DIAGNOSIS — K219 Gastro-esophageal reflux disease without esophagitis: Secondary | ICD-10-CM | POA: Diagnosis not present

## 2019-04-05 IMAGING — DX DG HAND COMPLETE 3+V*R*
3 series · 3 of 3 positions shown · non-contrast
Comparison: None.

CLINICAL DATA: Chronic Bilateral hand pain with NKI, evaluate for
OA

EXAM:
RIGHT HAND - COMPLETE 3+ VIEW

[dg hand complete right (1 of 3)]
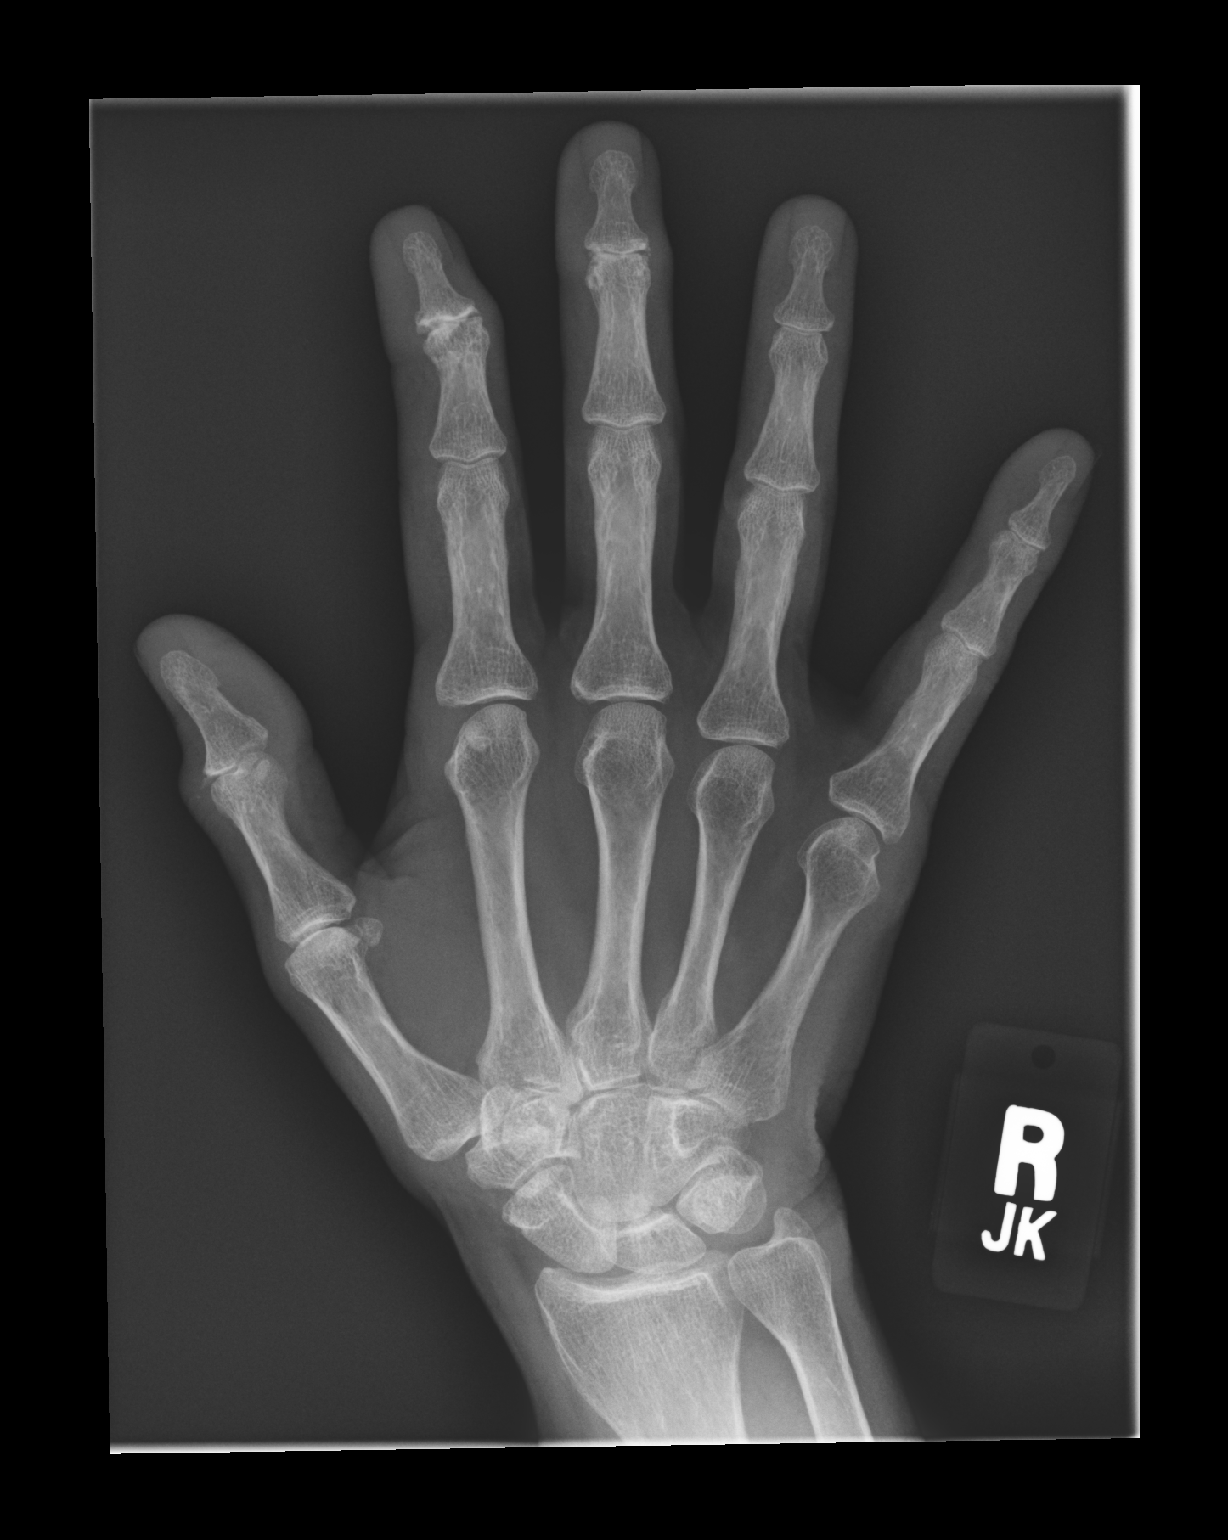

[dg hand complete right (2 of 3)]
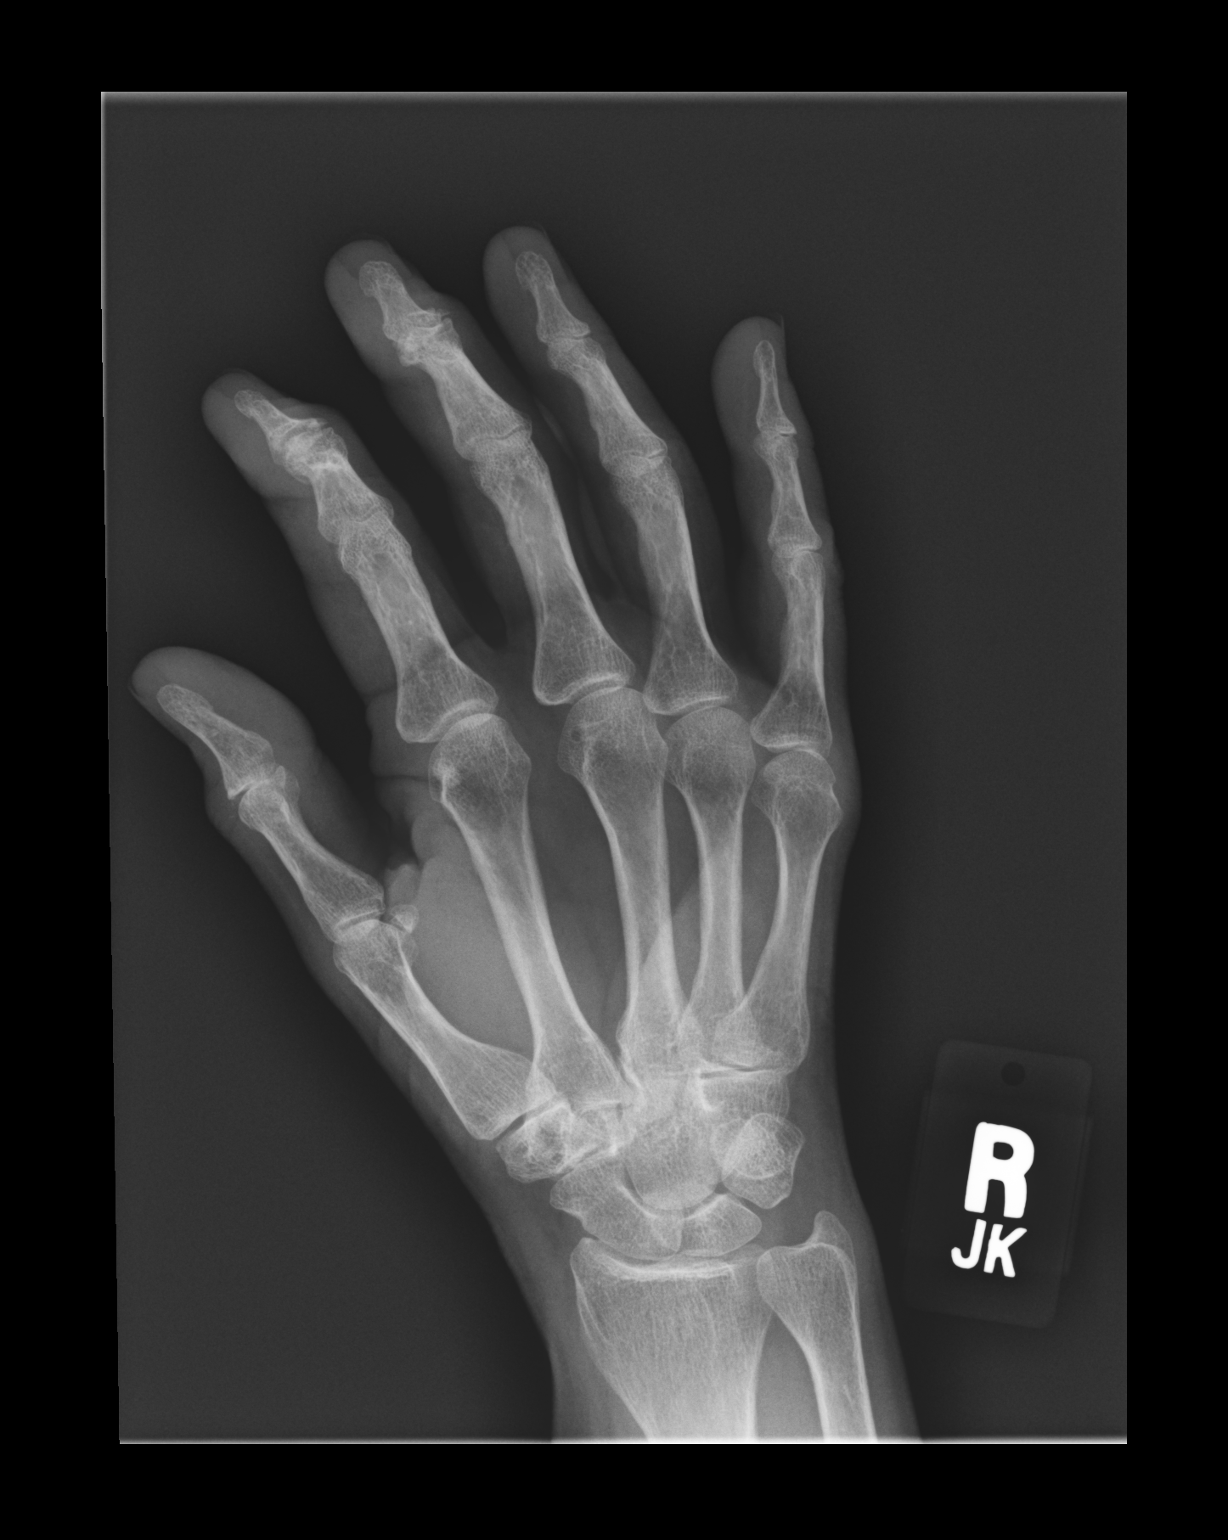

[dg hand complete right (3 of 3)]
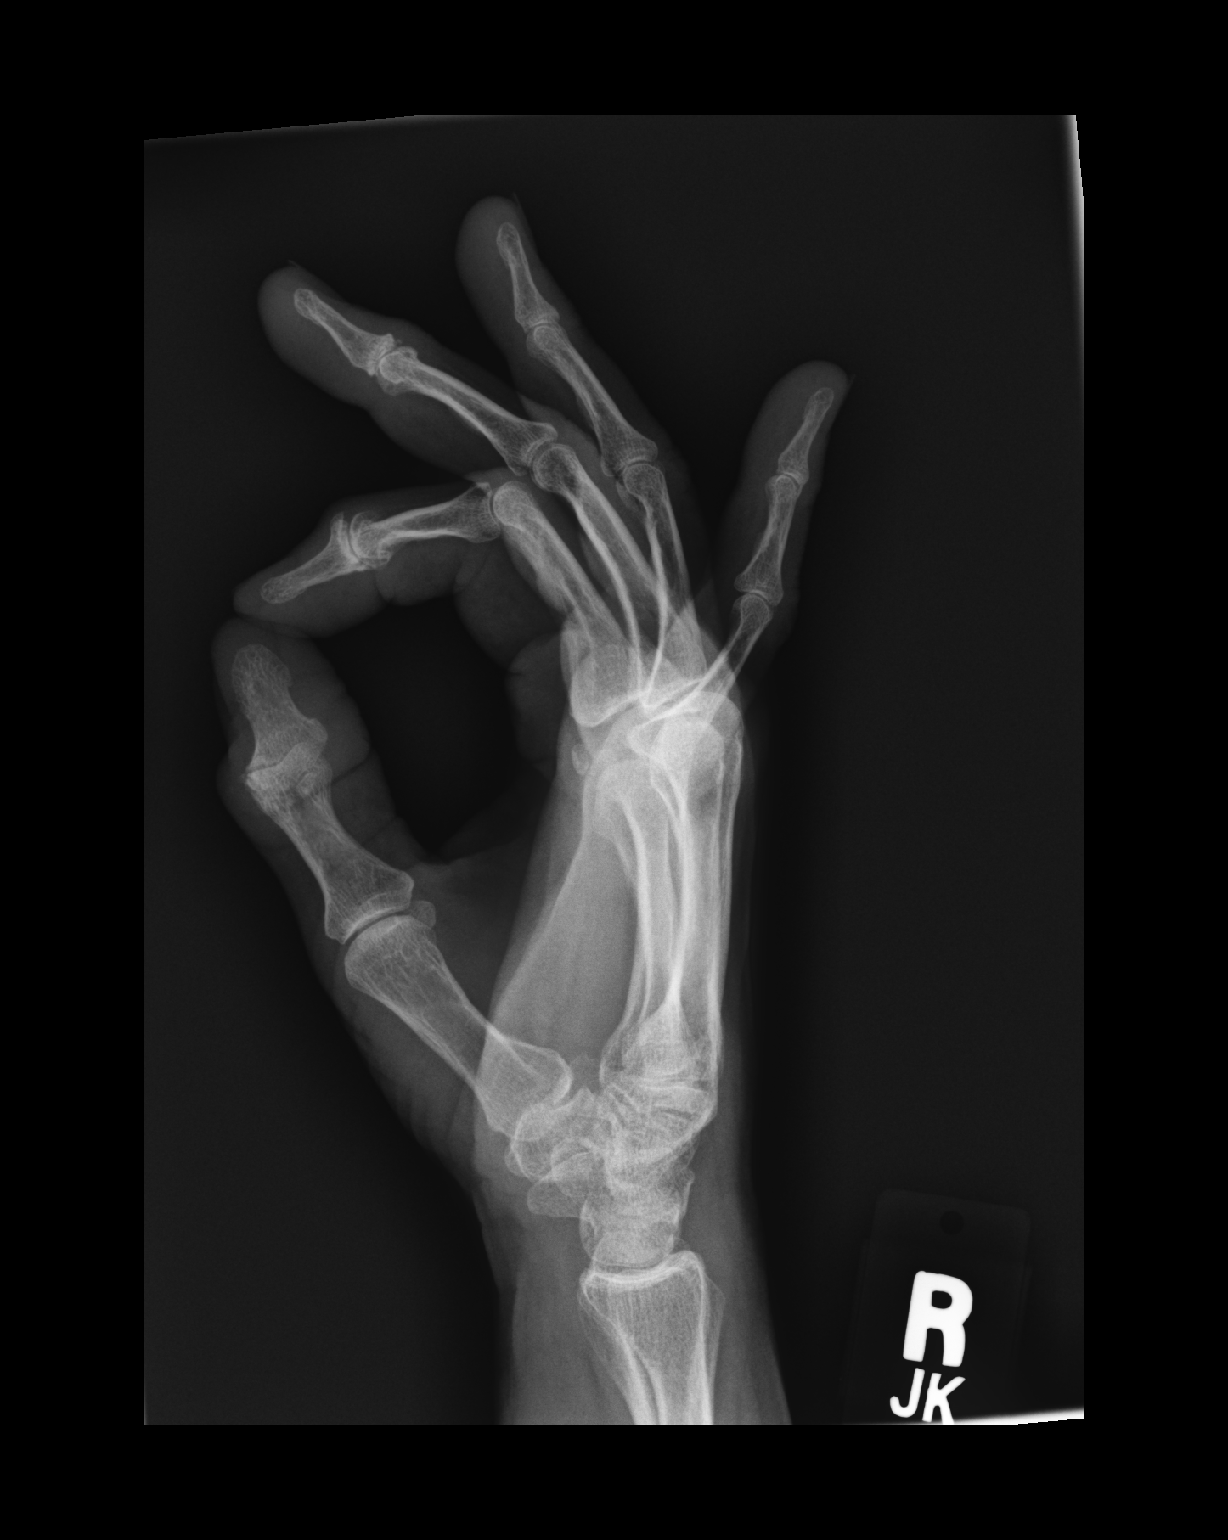

[3 of 3 positions shown; findings below may reference images not displayed]

FINDINGS: Degenerative changes noted at the second and third DIP joints, with
associated joint space narrowing and articular surface sclerosis,
degenerative osteoarthritis versus erosive osteoarthritis. Remainder
the joint spaces appear well maintained.

No acute appearing osseous abnormality. Soft tissues about the right
hand are unremarkable.
IMPRESSION: 1. Degenerative changes at the second and third DIP joints, at least
moderate in degree with associated joint space narrowings and
articular surface scleroses, compatible with degenerative
osteoarthritis, possibly a component of erosive osteoarthritis.
2. Remainder of the joint spaces appear well maintained.
3. No acute appearing abnormality.

## 2019-04-05 IMAGING — DX DG HAND COMPLETE 3+V*L*
3 series · 3 of 3 positions shown · non-contrast
Comparison: None.

CLINICAL DATA: Chronic Bilateral hand pain with NKI, evaluate for
OA

EXAM:
LEFT HAND - COMPLETE 3+ VIEW

[dg hand complete left (1 of 3)]
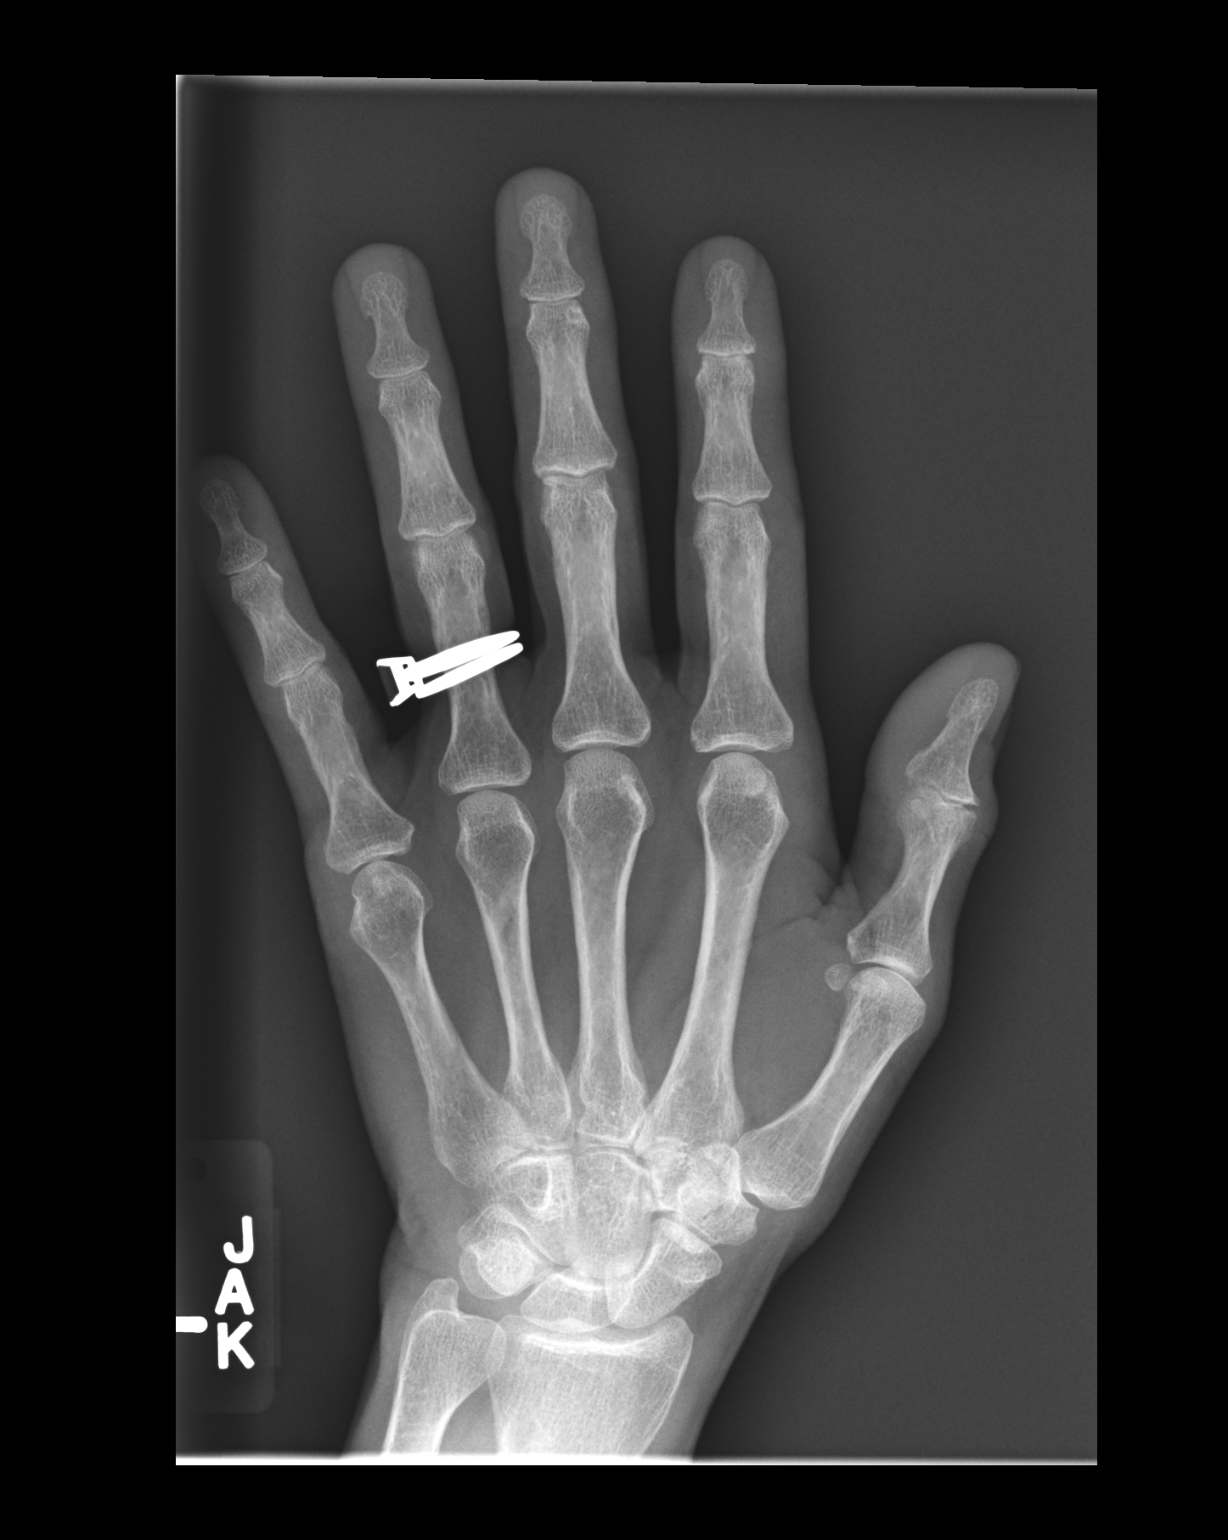

[dg hand complete left (2 of 3)]
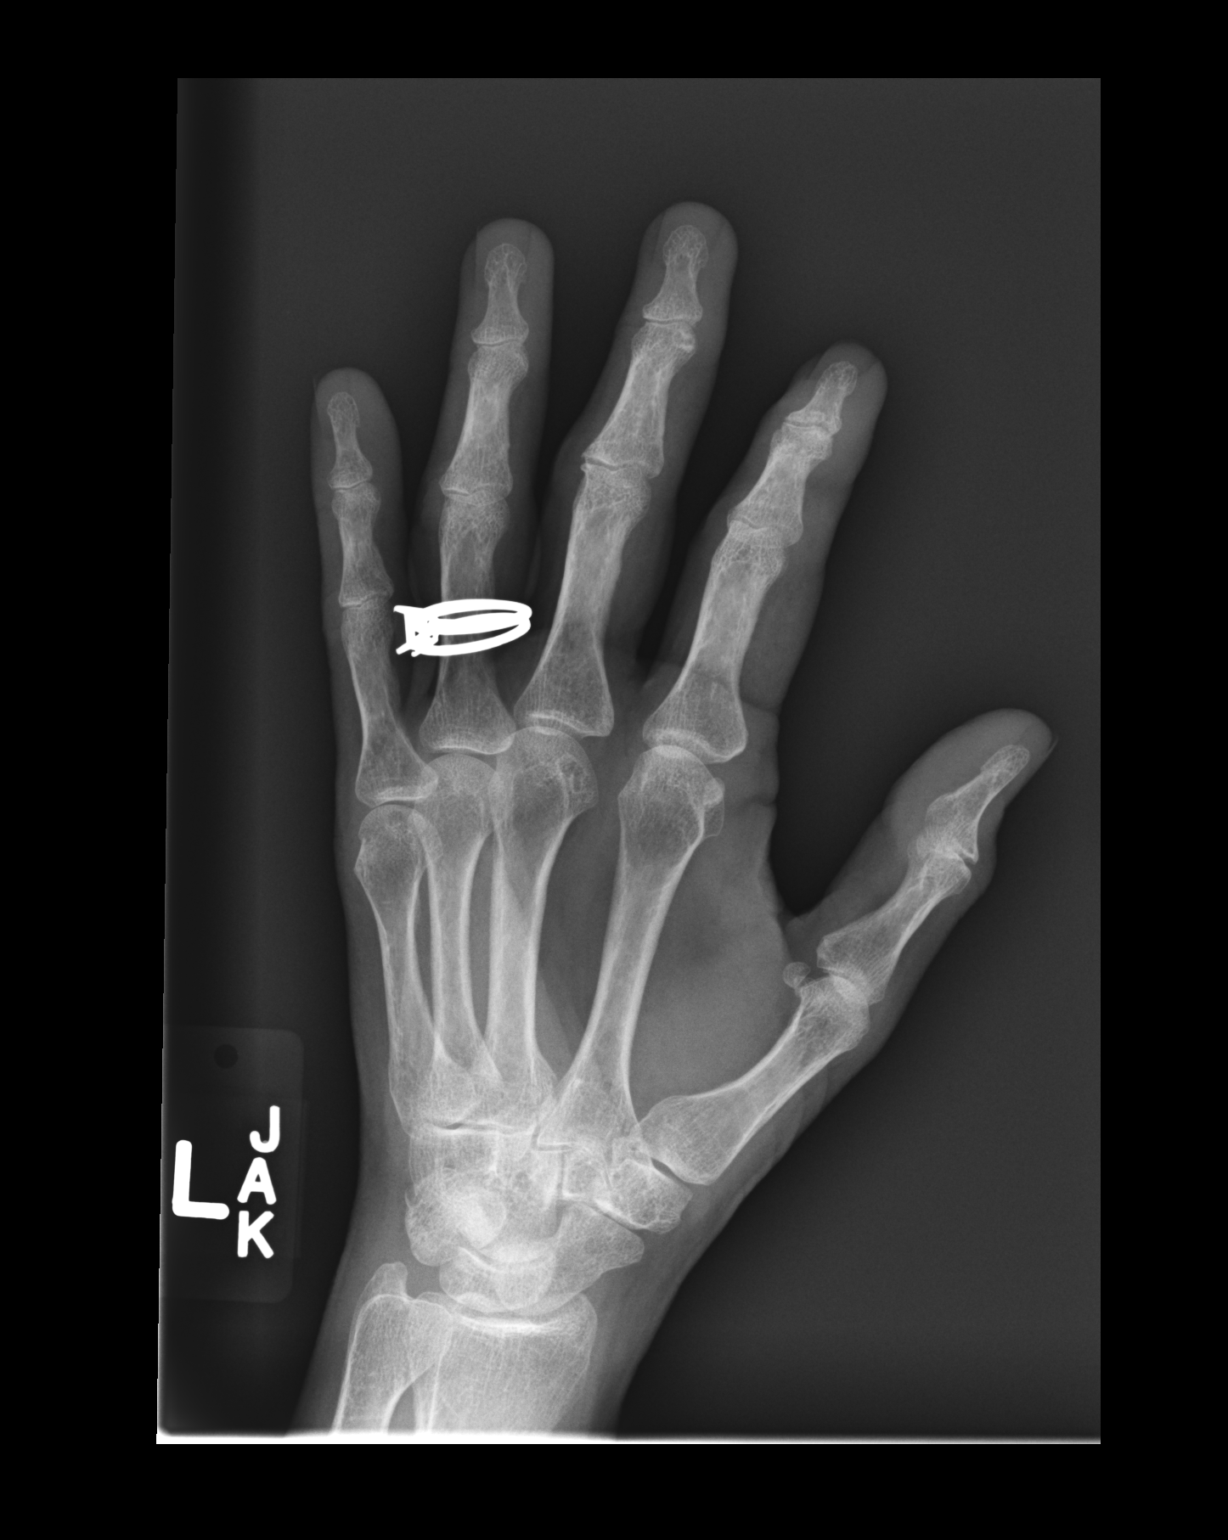

[dg hand complete left (3 of 3)]
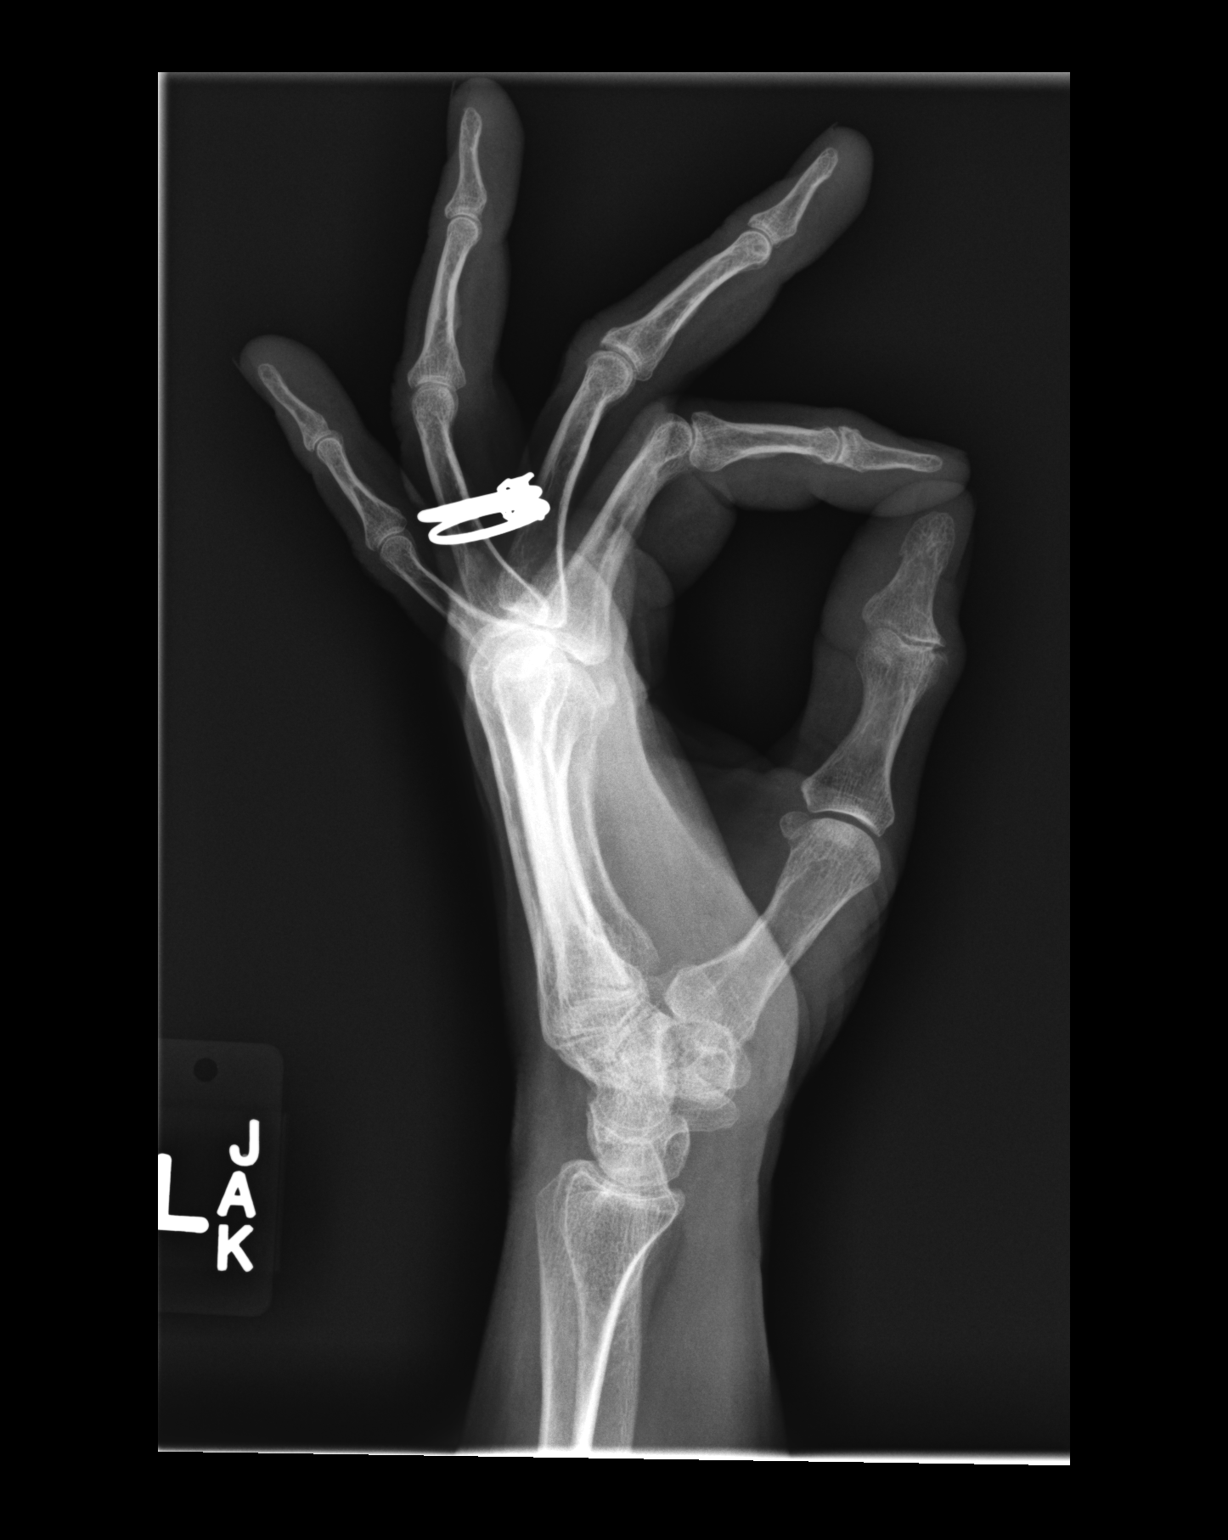

[3 of 3 positions shown; findings below may reference images not displayed]

FINDINGS: There is no evidence of fracture or dislocation. There is no
evidence of arthropathy or other focal bone abnormality. Soft
tissues are unremarkable.
IMPRESSION: Negative.

## 2019-06-30 ENCOUNTER — Encounter: Payer: Self-pay | Admitting: Obstetrics & Gynecology

## 2019-06-30 DIAGNOSIS — Z1231 Encounter for screening mammogram for malignant neoplasm of breast: Secondary | ICD-10-CM | POA: Diagnosis not present

## 2019-09-22 DIAGNOSIS — H04123 Dry eye syndrome of bilateral lacrimal glands: Secondary | ICD-10-CM | POA: Diagnosis not present

## 2019-09-26 ENCOUNTER — Ambulatory Visit: Payer: BC Managed Care – PPO | Attending: Internal Medicine

## 2019-09-26 DIAGNOSIS — Z23 Encounter for immunization: Secondary | ICD-10-CM

## 2019-09-26 NOTE — Progress Notes (Signed)
   Covid-19 Vaccination Clinic  Name:  Jacqueline Silva    MRN: [id number] DOB: October 22, 1960  09/26/2019  [name] was observed post Covid-19 immunization for 15 minutes without incident. She was provided with Vaccine Information Sheet and instruction to access the V-Safe system.   [name] was instructed to call 911 with any severe reactions post vaccine: Marland Kitchen Difficulty breathing  . Swelling of face and throat  . A fast heartbeat  . A bad rash all over body  . Dizziness and weakness   Immunizations Administered    Name Date Dose VIS Date Route   Pfizer COVID-19 Vaccine 09/26/2019  3:05 PM 0.3 mL 06/20/2019 Intramuscular   Manufacturer: [employer], [employer]   Lot: [id number]   NDC: [address]-1

## 2019-09-30 ENCOUNTER — Encounter: Payer: Self-pay | Admitting: Certified Nurse Midwife

## 2019-10-16 ENCOUNTER — Other Ambulatory Visit: Payer: Self-pay

## 2019-10-16 NOTE — Telephone Encounter (Signed)
Medication refill request: Fosamax   Last AEX: 07/16/2018  Next AEX: 11/20/2019 with MM Last MMG (if hormonal medication request): 06/30/2019 BI-RADS, 1:Neg  Refill authorized: #12, 0 RF pended.   Please advise and refill if appropriate.

## 2019-10-17 MED ORDER — ALENDRONATE SODIUM 70 MG PO TABS: 70.0000 mg | ORAL_TABLET | ORAL | 0 refills | Status: DC

## 2019-10-21 ENCOUNTER — Ambulatory Visit: Payer: BC Managed Care – PPO | Attending: Internal Medicine

## 2019-10-21 DIAGNOSIS — Z23 Encounter for immunization: Secondary | ICD-10-CM

## 2019-10-21 NOTE — Progress Notes (Signed)
   Covid-19 Vaccination Clinic  Name:  Jacqueline Silva    MRN: [id number] DOB: 15-Jul-1960  10/21/2019  [name] was observed post Covid-19 immunization for 15 minutes without incident. She was provided with Vaccine Information Sheet and instruction to access the V-Safe system.   [name] was instructed to call 911 with any severe reactions post vaccine: Marland Kitchen Difficulty breathing  . Swelling of face and throat  . A fast heartbeat  . A bad rash all over body  . Dizziness and weakness   Immunizations Administered    Name Date Dose VIS Date Route   Pfizer COVID-19 Vaccine 10/21/2019  3:51 PM 0.3 mL 06/20/2019 Intramuscular   Manufacturer: [employer], [employer]   Lot: [id number]   NDC: [address]-6

## 2019-11-14 NOTE — Progress Notes (Signed)
59 y.o. G21P1001 Married White or Caucasian female here for annual exam.  Doing well.  Did have a childhood friend who died of Covid after 14 days.    Denies vaginal bleeding.   Had blood work done at work and cholesterol was 223.  10 yr cardiovascular risk for pt with ACC/AHA risk calculator was 2.8%.  Will have this repeated at work this again this summer.    Has noticed some increased irritation with premarin cream.  Almost burns a little when she uses.    Patient's last menstrual period was 01/26/2013.          Sexually active: Yes.    The current method of family planning is post menopausal status.    Exercising: No.  exercise Smoker:  no  Health Maintenance: Pap:  05-19-15 neg HPV HR neg, 07-16-2018 neg HPV HR neg History of abnormal Pap:  yes MMG:  06-30-2019 category b density birads 1:neg Colonoscopy:  2020 f/u 59yrs BMD:   06-24-18 osteopenia f/u 25yrs TDaP:  2017 Pneumonia vaccine(s):  Not done Shingrix:   Not done Hep C testing: neg 2016 Screening Labs: poct urine-neg   reports that she has never smoked. She has never used smokeless tobacco. She reports current alcohol use of about 1.0 standard drinks of alcohol per week. She reports that she does not use drugs.  Past Medical History:  Diagnosis Date  . ASCUS on Pap smear 1/97  . Hemorrhoid   . HSV-2 (herpes simplex virus 2) infection 4/05  . Osteoporosis 9/13  . Vitamin D deficiency     Past Surgical History:  Procedure Laterality Date  . BREAST BIOPSY  1986  . ESOPHAGOGASTRODUODENOSCOPY     Dr. Loreta Ave    Current Outpatient Medications  Medication Sig Dispense Refill  . alendronate (FOSAMAX) 70 MG tablet Take 1 tablet (70 mg total) by mouth every 7 (seven) days. Take with a full glass of water on an empty stomach. 12 tablet 0  . Aspirin-Acetaminophen-Caffeine (EXCEDRIN MIGRAINE PO) Take by mouth as needed.    . conjugated estrogens (PREMARIN) vaginal cream 1/2 gram vaginally twice weekly 42.5 g 4  . Naproxen  Sodium (ALEVE PO) Take by mouth as needed.    . nitrofurantoin, macrocrystal-monohydrate, (MACROBID) 100 MG capsule Take one tablet orally after intercourse. 30 capsule 1  . valACYclovir (VALTREX) 500 MG tablet TAKE 1 TABLET BY MOUTH TWICE DAILY FOR 3 DAYS WITH SYMPTOMS THEN EVERY DAY FOR SUPPRESSION 30 tablet 2   No current facility-administered medications for this visit.    History reviewed. No pertinent family history.  Review of Systems  Constitutional: Negative.   HENT: Negative.   Eyes: Negative.   Respiratory: Negative.   Cardiovascular: Negative.   Gastrointestinal: Negative.   Endocrine: Negative.   Genitourinary:       Vaginal sores  Musculoskeletal: Negative.   Skin: Negative.   Allergic/Immunologic: Negative.   Neurological: Negative.   Psychiatric/Behavioral: Negative.     Exam:   BP 114/68   Pulse 68   Temp (!) 97 F (36.1 C) (Skin)   Resp 16   Ht 5' 4.75" (1.645 m)   Wt 153 lb (69.4 kg)   LMP 01/26/2013   BMI 25.66 kg/m   Height: 5' 4.75" (164.5 cm)  General appearance: alert, cooperative and appears stated age Head: Normocephalic, without obvious abnormality, atraumatic Neck: no adenopathy, supple, symmetrical, trachea midline and thyroid normal to inspection and palpation Lungs: clear to auscultation bilaterally Breasts: normal appearance, no masses or tenderness  Heart: regular rate and rhythm Abdomen: soft, non-tender; bowel sounds normal; no masses,  no organomegaly Extremities: extremities normal, atraumatic, no cyanosis or edema Skin: Skin color, texture, turgor normal. No rashes or lesions Lymph nodes: Cervical, supraclavicular, and axillary nodes normal. No abnormal inguinal nodes palpated Neurologic: Grossly normal   Pelvic: External genitalia:  no lesions              Urethra:  normal appearing urethra with no masses, tenderness or lesions              Bartholins and Skenes: normal                 Vagina: normal appearing vagina with  normal color and discharge, no lesions              Cervix: no lesions              Pap taken: No. Bimanual Exam:  Uterus:  normal size, contour, position, consistency, mobility, non-tender              Adnexa: normal adnexa and no mass, fullness, tenderness               Rectovaginal: Confirms               Anus:  normal sphincter tone, no lesions  Chaperone, Royal Hawthorn, CMA, was present for exam.  A:  Well Woman with normal exam PMP, no HTR Vaginal atrophic changes H/O osteoporosis in the spine, improved with Prolia, now on fosamax. H/o HSV Mildly elevated LDL/cholesterol  P:   Mammogram guidelines reviewed pap smear with HR HPV neg 2020.  Not obtained today. Lab work will be repeated at work this summer Order for MMG/BMD faxed to [facility] for Fosamax 70mg  weekly to pharmacy.  #12/4RF Will try estradiol tablets, one pv twice weekly, to see if this does not burn.  #24/4RF Return annually or prn

## 2019-11-19 ENCOUNTER — Other Ambulatory Visit: Payer: Self-pay

## 2019-11-20 ENCOUNTER — Other Ambulatory Visit: Payer: Self-pay

## 2019-11-20 ENCOUNTER — Ambulatory Visit (INDEPENDENT_AMBULATORY_CARE_PROVIDER_SITE_OTHER): Payer: BC Managed Care – PPO | Admitting: Obstetrics & Gynecology

## 2019-11-20 ENCOUNTER — Encounter: Payer: Self-pay | Admitting: Obstetrics & Gynecology

## 2019-11-20 VITALS — BP 114/68 | HR 68 | Temp 97.0°F | Resp 16 | Ht 64.75 in | Wt 153.0 lb

## 2019-11-20 DIAGNOSIS — Z01419 Encounter for gynecological examination (general) (routine) without abnormal findings: Secondary | ICD-10-CM | POA: Diagnosis not present

## 2019-11-20 DIAGNOSIS — N952 Postmenopausal atrophic vaginitis: Secondary | ICD-10-CM

## 2019-11-20 DIAGNOSIS — Z0000 Encounter for general adult medical examination without abnormal findings: Secondary | ICD-10-CM | POA: Diagnosis not present

## 2019-11-20 LAB — POCT URINALYSIS DIPSTICK
Bilirubin, UA: NEGATIVE
Blood, UA: NEGATIVE
Glucose, UA: NEGATIVE
Ketones, UA: NEGATIVE
Leukocytes, UA: NEGATIVE
Nitrite, UA: NEGATIVE
Protein, UA: NEGATIVE
Urobilinogen, UA: NEGATIVE E.U./dL — AB
pH, UA: 5 (ref 5.0–8.0)

## 2019-11-20 MED ORDER — ESTRADIOL 10 MCG VA TABS: 1.0000 | ORAL_TABLET | VAGINAL | 4 refills | Status: DC

## 2019-11-20 MED ORDER — ALENDRONATE SODIUM 70 MG PO TABS: 70.0000 mg | ORAL_TABLET | ORAL | 0 refills | Status: DC

## 2019-11-20 NOTE — Patient Instructions (Signed)
Plan to do your bone density with your mammogram in December.  I will fax the order over today.

## 2020-01-08 ENCOUNTER — Other Ambulatory Visit: Payer: Self-pay | Admitting: Obstetrics & Gynecology

## 2020-01-08 NOTE — Telephone Encounter (Signed)
Medication refill request: Alendronate Last AEX:  11/20/19 SM Next AEX: 12/10/20 Last MMG (if hormonal medication request): n/a Refill authorized: Today, please advise; Dr. Hyacinth Meeker out of office 01/08/20

## 2020-02-04 ENCOUNTER — Encounter: Payer: Self-pay | Admitting: Obstetrics & Gynecology

## 2020-02-05 ENCOUNTER — Other Ambulatory Visit: Payer: Self-pay | Admitting: Obstetrics & Gynecology

## 2020-02-05 DIAGNOSIS — N952 Postmenopausal atrophic vaginitis: Secondary | ICD-10-CM

## 2020-02-05 NOTE — Telephone Encounter (Signed)
Medication refill request: Valacyclovir 500mg   Last AEX:  11/20/19 Next AEX: 12/10/20 Last MMG (if hormonal medication request): 06/30/19  Normal  Refill authorized: 30/2

## 2020-02-24 ENCOUNTER — Telehealth: Payer: Self-pay

## 2020-02-24 ENCOUNTER — Encounter: Payer: Self-pay | Admitting: Obstetrics & Gynecology

## 2020-02-24 NOTE — Telephone Encounter (Signed)
AEX 11/20/19 Labs to be repeated at work in the summer.    Copy of labs printed and to Dr. Hyacinth Meeker for review and scan.  MyChart message to patient.   Encounter closed.

## 2020-02-24 NOTE — Telephone Encounter (Signed)
[email or web address].pdf  Jacqueline Silva [facility] Clinical Pool My 2021 lab results are attached for your records. Jacqueline Silva

## 2020-03-25 ENCOUNTER — Other Ambulatory Visit: Payer: Self-pay | Admitting: Obstetrics and Gynecology

## 2020-03-25 NOTE — Telephone Encounter (Signed)
Medication refill request: Alendronate Last AEX:  11/20/19 Dr. Hyacinth Meeker Next AEX: 12/10/20 Last MMG (if hormonal medication request): n/a Refill authorized: Today, please advise

## 2020-04-26 ENCOUNTER — Other Ambulatory Visit: Payer: Self-pay | Admitting: Family Medicine

## 2020-04-26 DIAGNOSIS — M25512 Pain in left shoulder: Secondary | ICD-10-CM

## 2020-04-26 DIAGNOSIS — M12812 Other specific arthropathies, not elsewhere classified, left shoulder: Secondary | ICD-10-CM

## 2020-05-14 ENCOUNTER — Ambulatory Visit
Admission: RE | Admit: 2020-05-14 | Discharge: 2020-05-14 | Disposition: A | Discharge status: Home / Self Care | Payer: BC Managed Care – PPO | Source: Ambulatory Visit | Attending: Family Medicine | Admitting: Family Medicine

## 2020-05-14 DIAGNOSIS — M12812 Other specific arthropathies, not elsewhere classified, left shoulder: Secondary | ICD-10-CM

## 2020-05-14 DIAGNOSIS — M25512 Pain in left shoulder: Secondary | ICD-10-CM

## 2020-05-14 DIAGNOSIS — M19012 Primary osteoarthritis, left shoulder: Secondary | ICD-10-CM | POA: Diagnosis not present

## 2020-05-17 ENCOUNTER — Encounter: Payer: Self-pay | Admitting: Obstetrics & Gynecology

## 2020-05-18 ENCOUNTER — Other Ambulatory Visit: Payer: Self-pay | Admitting: Obstetrics & Gynecology

## 2020-05-18 DIAGNOSIS — M8580 Other specified disorders of bone density and structure, unspecified site: Secondary | ICD-10-CM

## 2020-05-19 DIAGNOSIS — L570 Actinic keratosis: Secondary | ICD-10-CM | POA: Diagnosis not present

## 2020-05-19 DIAGNOSIS — L578 Other skin changes due to chronic exposure to nonionizing radiation: Secondary | ICD-10-CM | POA: Diagnosis not present

## 2020-05-19 DIAGNOSIS — L821 Other seborrheic keratosis: Secondary | ICD-10-CM | POA: Diagnosis not present

## 2020-05-19 DIAGNOSIS — D225 Melanocytic nevi of trunk: Secondary | ICD-10-CM | POA: Diagnosis not present

## 2020-05-26 ENCOUNTER — Other Ambulatory Visit: Payer: Self-pay | Admitting: Family Medicine

## 2020-05-26 DIAGNOSIS — M25512 Pain in left shoulder: Secondary | ICD-10-CM

## 2020-06-14 ENCOUNTER — Other Ambulatory Visit: Payer: Self-pay

## 2020-06-14 ENCOUNTER — Encounter: Payer: Self-pay | Admitting: Physical Therapy

## 2020-06-14 ENCOUNTER — Ambulatory Visit (INDEPENDENT_AMBULATORY_CARE_PROVIDER_SITE_OTHER): Payer: BC Managed Care – PPO | Admitting: Physical Therapy

## 2020-06-14 DIAGNOSIS — M25612 Stiffness of left shoulder, not elsewhere classified: Secondary | ICD-10-CM

## 2020-06-14 DIAGNOSIS — M25512 Pain in left shoulder: Secondary | ICD-10-CM

## 2020-06-14 NOTE — Patient Instructions (Signed)
Access Code: [email or web address] URL: [email or web address] Date: 06/14/2020 Prepared by: Sedalia Muta  Exercises Supine Shoulder Flexion with Dowel - 2 x daily - 1 sets - 10 reps - 5 hold Supine Shoulder External Rotation with Dowel - 1 x daily - 2 sets - 10 reps - 5 hold Supine Chest Stretch with Elbows Bent - 2 x daily - 1 sets - 10 reps - 10 hold Standing Shoulder Internal Rotation Stretch with Hands Behind Back - 2 x daily - 1 sets - 5-10 reps - 10 hold

## 2020-06-17 ENCOUNTER — Ambulatory Visit (INDEPENDENT_AMBULATORY_CARE_PROVIDER_SITE_OTHER): Payer: BC Managed Care – PPO | Admitting: Physical Therapy

## 2020-06-17 ENCOUNTER — Encounter: Payer: Self-pay | Admitting: Physical Therapy

## 2020-06-17 ENCOUNTER — Other Ambulatory Visit: Payer: Self-pay

## 2020-06-17 DIAGNOSIS — M25612 Stiffness of left shoulder, not elsewhere classified: Secondary | ICD-10-CM

## 2020-06-17 DIAGNOSIS — M25512 Pain in left shoulder: Secondary | ICD-10-CM

## 2020-06-17 NOTE — Therapy (Signed)
[facility] [address] [place], [place], [address] Phone: [phone]   Fax:  [phone]  Physical Therapy Treatment  Patient Details  Name: Jacqueline Silva MRN: [id number] Date of Birth: 07-Jun-1961 Referring Provider (PT): Shelly Flatten   Encounter Date: 06/17/2020   PT End of Session - 06/17/20 1250    Visit Number 2    Number of Visits 12    Date for PT Re-Evaluation 07/26/20    Authorization Type BCBS    PT Start Time 1102    PT Stop Time 1142    PT Time Calculation (min) 40 min    Activity Tolerance Patient tolerated treatment well    Behavior During Therapy [facility] for tasks assessed/performed           Past Medical History:  Diagnosis Date  . ASCUS on Pap smear 1/97  . Hemorrhoid   . HSV-2 (herpes simplex virus 2) infection 4/05  . Osteoporosis 9/13  . Vitamin D deficiency     Past Surgical History:  Procedure Laterality Date  . BREAST BIOPSY  1986  . ESOPHAGOGASTRODUODENOSCOPY     Dr. Loreta Ave    There were no vitals filed for this visit.   Subjective Assessment - 06/17/20 1249    Subjective Pt states increased pain 2 days ago after doing exercises, mild soreness today. Has been doing HEP    Currently in Pain? Yes    Pain Score 6     Pain Location Shoulder    Pain Orientation Left    Pain Descriptors / Indicators Aching    Pain Type Acute pain    Pain Onset More than a month ago    Pain Frequency Intermittent                             [facility] Adult PT Treatment/Exercise - 06/17/20 1252      Exercises   Exercises Shoulder      Shoulder Exercises: Supine   External Rotation 15 reps;AAROM    External Rotation Limitations cane    Flexion AAROM;15 reps    Flexion Limitations cane      Shoulder Exercises: Sidelying   External Rotation 15 reps;AROM      Shoulder Exercises: Standing   Row 20 reps;Theraband    Theraband Level (Shoulder Row) Level 3 (Green)      Shoulder Exercises:  Pulleys   Flexion 3 minutes      Shoulder Exercises: ROM/Strengthening   Wall Wash 2 UE x 10,  1 UE x 10;      Shoulder Exercises: Stretch   Internal Rotation Stretch Limitations Stick behind back:  , Ext, across back, and IR behind back x 10 each;    Other Shoulder Stretches ER butterfly supine x10 3 sec holds      Manual Therapy   Manual Therapy Joint mobilization;Passive ROM    Joint Mobilization Post and Inf GHJ mobs, gr 3;   LAD    Passive ROM L shoulder, all motions                    PT Short Term Goals - 06/17/20 1239      PT SHORT TERM GOAL #1   Title Pt to be independent with initial HEP    Time 2    Period Weeks    Status New    Target Date 06/28/20  PT Long Term Goals - 06/17/20 1239      PT LONG TERM GOAL #1   Title Pt to be independent wtih final HEP    Time 6    Period Weeks    Status New    Target Date 07/26/20      PT LONG TERM GOAL #2   Title Pt to demo improved L shoulder AROM to be WNL for improved ADLs and IADLs.    Time 6    Period Weeks    Status New    Target Date 07/26/20      PT LONG TERM GOAL #3   Title Pt to demo improved strength of L shoulder to at least 4+/5 to improve ability for reaching, lifting, carrying and IADLS.    Time 6    Period Weeks    Status New    Target Date 07/26/20      PT LONG TERM GOAL #4   Title Pt to reports pain in L shoulder to be 0-2/10 with activity and elevation.    Time 6    Period Weeks    Status New    Target Date 07/26/20                 Plan - 06/17/20 1251    Clinical Impression Statement Pt with improved ROM for flexion and IR/ER today for PROM and AAROM. Does have stiffness and pain at end ranges. Reviewed HEP and discussed heat and ice as needed for pain. Plan to progress ROM and strength as tolerated.    Examination-Activity Limitations Lift;Reach Overhead;Carry;Dressing    Examination-Participation Restrictions Cleaning;Meal Prep;Yard Work;Community  Activity;Shop;Laundry    Stability/Clinical Decision Making Stable/Uncomplicated    Rehab Potential Good    PT Frequency 2x / week    PT Duration 6 weeks    PT Treatment/Interventions ADLs/Self Care Home Management;Cryotherapy;Electrical Stimulation;Ultrasound;Moist Heat;Iontophoresis 4mg /ml Dexamethasone;DME Instruction;Functional mobility training;Therapeutic activities;Therapeutic exercise;Patient/family education;Neuromuscular re-education;Manual techniques;Taping;Dry needling;Passive range of motion;Spinal Manipulations;Joint Manipulations    Consulted and Agree with Plan of Care Patient           Patient will benefit from skilled therapeutic intervention in order to improve the following deficits and impairments:  Pain,Decreased mobility,Increased muscle spasms,Hypomobility,Decreased strength,Decreased range of motion,Decreased activity tolerance,Impaired flexibility  Visit Diagnosis: Acute pain of left shoulder  Stiffness of shoulder joint, left     Problem List Patient Active Problem List   Diagnosis Date Noted  . Osteoporosis 07/08/2014   07/10/2014, PT, DPT 12:54 PM  06/17/20    [facility] [address] [place], [place], [place] Phone: [phone]   Fax:  [phone]  Name: Jacqueline Silva MRN: [name] Date of Birth: 06-02-1961

## 2020-06-17 NOTE — Therapy (Signed)
[facility] [address] [place], [place], [address] Phone: [phone]   Fax:  [phone]  Physical Therapy Evaluation  Patient Details  Name: Jacqueline Silva MRN: [id number] Date of Birth: 1961/05/15 Referring Provider (PT): Shelly Flatten   Encounter Date: 06/14/2020   PT End of Session - 06/17/20 1236    Visit Number 1    Number of Visits 12    Date for PT Re-Evaluation 07/26/20    Authorization Type BCBS    PT Start Time 1300    PT Stop Time 1340    PT Time Calculation (min) 40 min    Activity Tolerance Patient tolerated treatment well    Behavior During Therapy [facility] for tasks assessed/performed           Past Medical History:  Diagnosis Date  . ASCUS on Pap smear 1/97  . Hemorrhoid   . HSV-2 (herpes simplex virus 2) infection 4/05  . Osteoporosis 9/13  . Vitamin D deficiency     Past Surgical History:  Procedure Laterality Date  . BREAST BIOPSY  1986  . ESOPHAGOGASTRODUODENOSCOPY     Dr. Loreta Ave    There were no vitals filed for this visit.    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 Is R handed.States continued pain with dressing, reaching, behind back motion.    Limitations Lifting;House hold activities    Patient Stated Goals Decreased pain.    Currently in Pain? Yes    Pain Score 8     Pain Location Shoulder    Pain Orientation Left    Pain Descriptors / Indicators Aching    Pain Type Acute pain    Pain Onset More than a month ago    Pain Frequency Intermittent    Aggravating Factors  reaching, lifting, behind back motions, dressing.              [facility] PT Assessment - 06/17/20 0001      Assessment   Medical Diagnosis L shoulder pain    Referring Provider (PT) Shelly Flatten    Hand Dominance Right    Prior Therapy no      Balance Screen   Has the patient fallen in the  past 6 months No      Prior Function   Level of Independence Independent      Cognition   Overall Cognitive Status Within Functional Limits for tasks assessed      AROM   Left Shoulder Flexion 140 Degrees    Left Shoulder ABduction 140 Degrees    Left Shoulder Internal Rotation 55 Degrees    Left Shoulder External Rotation 60 Degrees      PROM   Left Shoulder Flexion 140 Degrees    Left Shoulder ABduction 140 Degrees    Left Shoulder Internal Rotation 55 Degrees    Left Shoulder External Rotation 60 Degrees      Strength   Overall Strength Comments taken at mid range    Left Shoulder Flexion 4-/5    Left Shoulder ABduction 4-/5    Left Shoulder Internal Rotation 4/5    Left Shoulder External Rotation 4/5      Palpation   Palpation comment hypomobile flexion, Er, and IR, IR behind back.  Objective measurements completed on examination: See above findings.       [facility] Adult PT Treatment/Exercise - 06/17/20 0001      Exercises   Exercises Shoulder      Shoulder Exercises: Supine   External Rotation 15 reps;AAROM    External Rotation Limitations cane    Flexion AAROM;15 reps    Flexion Limitations cane      Shoulder Exercises: Stretch   Internal Rotation Stretch 10 seconds    Internal Rotation Stretch Limitations 2 hand behind back 5 sec x 10    Other Shoulder Stretches ER butterfly supine x10 3 sec holds                  PT Education - 06/17/20 1235    Education Details PT POC, Exam findings, HEP    Person(s) Educated Patient    Methods Explanation;Demonstration;Verbal cues;Handout;Tactile cues    Comprehension Verbalized understanding;Returned demonstration;Verbal cues required;Need further instruction;Tactile cues required            PT Short Term Goals - 06/17/20 1239      PT SHORT TERM GOAL #1   Title Pt to be independent with initial HEP    Time 2    Period Weeks    Status New    Target Date 06/28/20              PT Long Term Goals - 06/17/20 1239      PT LONG TERM GOAL #1   Title Pt to be independent wtih final HEP    Time 6    Period Weeks    Status New    Target Date 07/26/20      PT LONG TERM GOAL #2   Title Pt to demo improved L shoulder AROM to be WNL for improved ADLs and IADLs.    Time 6    Period Weeks    Status New    Target Date 07/26/20      PT LONG TERM GOAL #3   Title Pt to demo improved strength of L shoulder to at least 4+/5 to improve ability for reaching, lifting, carrying and IADLS.    Time 6    Period Weeks    Status New    Target Date 07/26/20      PT LONG TERM GOAL #4   Title Pt to reports pain in L shoulder to be 0-2/10 with activity and elevation.    Time 6    Period Weeks    Status New    Target Date 07/26/20                  Plan - 06/17/20 1241    Clinical Impression Statement Pt presents wtih primary complaint of increased pain in L shoulder. She has joint stiffness that is limiting full PROM and AROM. She has weakness in shoulder musculature as well. Pt with limited ability for reaching, carrying, lifting, and IADLS, due to pain and deficits. Pt to benefit from skilled PT to improve.    Examination-Activity Limitations Lift;Reach Overhead;Carry;Dressing    Examination-Participation Restrictions Cleaning;Meal Prep;Yard Work;Community Activity;Shop;Laundry    Stability/Clinical Decision Making Stable/Uncomplicated    Clinical Decision Making Low    Rehab Potential Good    PT Frequency 2x / week    PT Duration 6 weeks    PT Treatment/Interventions ADLs/Self Care Home Management;Cryotherapy;Electrical Stimulation;Ultrasound;Moist Heat;Iontophoresis 4mg /ml Dexamethasone;DME Instruction;Functional mobility training;Therapeutic activities;Therapeutic exercise;Patient/family education;Neuromuscular re-education;Manual techniques;Taping;Dry needling;Passive range of motion;Spinal Manipulations;Joint Manipulations    Consulted and  Agree  with Plan of Care Patient           Patient will benefit from skilled therapeutic intervention in order to improve the following deficits and impairments:  Pain,Decreased mobility,Increased muscle spasms,Hypomobility,Decreased strength,Decreased range of motion,Decreased activity tolerance,Impaired flexibility  Visit Diagnosis: Acute pain of left shoulder  Stiffness of shoulder joint, left     Problem List Patient Active Problem List   Diagnosis Date Noted  . Osteoporosis 07/08/2014    Sedalia Muta, PT, DPT 12:47 PM  06/17/20    [facility] [address] [place], [place], [address] Phone: [phone]   Fax:  [phone]  Name: Jacqueline Silva MRN: [id number] Date of Birth: November 06, 1960

## 2020-06-22 ENCOUNTER — Ambulatory Visit: Payer: BC Managed Care – PPO | Admitting: Physical Therapy

## 2020-06-22 ENCOUNTER — Encounter: Payer: Self-pay | Admitting: Physical Therapy

## 2020-06-22 ENCOUNTER — Other Ambulatory Visit: Payer: Self-pay

## 2020-06-22 DIAGNOSIS — M25612 Stiffness of left shoulder, not elsewhere classified: Secondary | ICD-10-CM | POA: Diagnosis not present

## 2020-06-22 DIAGNOSIS — M25512 Pain in left shoulder: Secondary | ICD-10-CM | POA: Diagnosis not present

## 2020-06-22 NOTE — Therapy (Signed)
[facility] [address] [place], [place], [address] Phone: [phone]   Fax:  [phone]  Physical Therapy Treatment  Patient Details  Name: Jacqueline Silva MRN: [id number] Date of Birth: [birth year] Referring Provider (PT): Shelly Flatten   Encounter Date: 06/22/2020   PT End of Session - 06/22/20 1136    Visit Number 3    Number of Visits 12    Date for PT Re-Evaluation 07/26/20    Authorization Type BCBS    PT Start Time 1105    PT Stop Time 1143    PT Time Calculation (min) 38 min    Activity Tolerance Patient tolerated treatment well    Behavior During Therapy [facility] for tasks assessed/performed           Past Medical History:  Diagnosis Date   ASCUS on Pap smear 1/97   Hemorrhoid    HSV-2 (herpes simplex virus 2) infection 4/05   Osteoporosis 9/13   Vitamin D deficiency     Past Surgical History:  Procedure Laterality Date   BREAST BIOPSY  1986   ESOPHAGOGASTRODUODENOSCOPY     Dr. Loreta Ave    There were no vitals filed for this visit.   Subjective Assessment - 06/22/20 1135    Subjective Pt states soreness with HEP and stretches. Minimal sorenes with regular daily activities.    Currently in Pain? Yes    Pain Score 5     Pain Location Shoulder    Pain Orientation Left    Pain Descriptors / Indicators Aching    Pain Type Acute pain    Pain Onset More than a month ago    Pain Frequency Intermittent              [facility] PT Assessment - 06/22/20 0001      PROM   Left Shoulder Flexion 150 Degrees    Left Shoulder ABduction 150 Degrees    Left Shoulder Internal Rotation 60 Degrees    Left Shoulder External Rotation 65 Degrees                         [facility] Adult PT Treatment/Exercise - 06/22/20 0001      Exercises   Exercises Shoulder      Shoulder Exercises: Supine   External Rotation AAROM;20 reps    External Rotation Limitations cane    Flexion --    Flexion Limitations --       Shoulder Exercises: Sidelying   External Rotation 15 reps;AROM    External Rotation Weight (lbs) 1      Shoulder Exercises: Standing   Flexion AROM;15 reps    Row 20 reps;Theraband    Theraband Level (Shoulder Row) Level 3 (Green)      Shoulder Exercises: Pulleys   Flexion 3 minutes      Shoulder Exercises: ROM/Strengthening   Wall Wash 1 UE x 20;      Shoulder Exercises: Stretch   Internal Rotation Stretch Limitations Stick behind back:  , Ext, across back, and IR behind back x 10 each;    Other Shoulder Stretches ER butterfly supine x10 3 sec holds      Manual Therapy   Manual Therapy Joint mobilization;Passive ROM    Joint Mobilization Post and Inf GHJ mobs, gr 3;   LAD    Passive ROM L shoulder, all motions  PT Short Term Goals - 06/17/20 1239      PT SHORT TERM GOAL #1   Title Pt to be independent with initial HEP    Time 2    Period Weeks    Status New    Target Date 06/28/20             PT Long Term Goals - 06/17/20 1239      PT LONG TERM GOAL #1   Title Pt to be independent wtih final HEP    Time 6    Period Weeks    Status New    Target Date 07/26/20      PT LONG TERM GOAL #2   Title Pt to demo improved L shoulder AROM to be WNL for improved ADLs and IADLs.    Time 6    Period Weeks    Status New    Target Date 07/26/20      PT LONG TERM GOAL #3   Title Pt to demo improved strength of L shoulder to at least 4+/5 to improve ability for reaching, lifting, carrying and IADLS.    Time 6    Period Weeks    Status New    Target Date 07/26/20      PT LONG TERM GOAL #4   Title Pt to reports pain in L shoulder to be 0-2/10 with activity and elevation.    Time 6    Period Weeks    Status New    Target Date 07/26/20                 Plan - 06/22/20 1152    Clinical Impression Statement Pt with improved PROM and AROM today. Minimal pain with ROM, does have soreness at end of available range, and mild  limitation for full flex, IR, and ER. Plan to continue as tolerated.    Examination-Activity Limitations Lift;Reach Overhead;Carry;Dressing    Examination-Participation Restrictions Cleaning;Meal Prep;Yard Work;Community Activity;Shop;Laundry    Stability/Clinical Decision Making Stable/Uncomplicated    Rehab Potential Good    PT Frequency 2x / week    PT Duration 6 weeks    PT Treatment/Interventions ADLs/Self Care Home Management;Cryotherapy;Electrical Stimulation;Ultrasound;Moist Heat;Iontophoresis 4mg /ml Dexamethasone;DME Instruction;Functional mobility training;Therapeutic activities;Therapeutic exercise;Patient/family education;Neuromuscular re-education;Manual techniques;Taping;Dry needling;Passive range of motion;Spinal Manipulations;Joint Manipulations    Consulted and Agree with Plan of Care Patient           Patient will benefit from skilled therapeutic intervention in order to improve the following deficits and impairments:  Pain,Decreased mobility,Increased muscle spasms,Hypomobility,Decreased strength,Decreased range of motion,Decreased activity tolerance,Impaired flexibility  Visit Diagnosis: Acute pain of left shoulder  Stiffness of shoulder joint, left     Problem List Patient Active Problem List   Diagnosis Date Noted   Osteoporosis 07/08/2014   07/10/2014, PT, DPT 11:56 AM  06/22/20    [facility] [place] [address] [place], [place], [place] Phone: [phone]   Fax:  [phone]  Name: Jacqueline Silva MRN: [name] Date of Birth: September 09, 1960

## 2020-06-23 ENCOUNTER — Telehealth: Payer: Self-pay

## 2020-06-23 DIAGNOSIS — M8580 Other specified disorders of bone density and structure, unspecified site: Secondary | ICD-10-CM

## 2020-06-23 MED ORDER — ESTRADIOL 10 MCG VA TABS: 1.0000 | ORAL_TABLET | VAGINAL | 1 refills | Status: DC

## 2020-06-23 NOTE — Telephone Encounter (Signed)
Spoke with pt. Pt states needing new Rx for estrogen vaginal tablets sent to [facility] due to insurance change. Pt has enough pills through end of 2021. Pt asking for 90 day supply  Rx Estrogen vaginal tablets # 24, 1 RF to get to [facility] scheduled for 11/2020 with Dr Oscar La.  Pt thankful for Rx.   Pt has scheduled screening MMG at [facility] on 06/30/20. Pt states is due for BMD as well, but machine broken at [facility] at this time. Pt advised new orders placed for BMD at [facility]. Pt agreeable and will call to make appt.   Routing to Dr Oscar La for review Encounter closed

## 2020-06-23 NOTE — Telephone Encounter (Signed)
Patient is calling in regards to needing a 90 day refill of Vagifem. [facility] states [facility] to [facility]. [facility] [phone].

## 2020-06-24 ENCOUNTER — Encounter: Payer: Self-pay | Admitting: Physical Therapy

## 2020-06-24 ENCOUNTER — Ambulatory Visit: Payer: BC Managed Care – PPO | Admitting: Physical Therapy

## 2020-06-24 DIAGNOSIS — M25512 Pain in left shoulder: Secondary | ICD-10-CM

## 2020-06-24 DIAGNOSIS — M25612 Stiffness of left shoulder, not elsewhere classified: Secondary | ICD-10-CM | POA: Diagnosis not present

## 2020-06-24 NOTE — Therapy (Signed)
[facility] [address] [place], [place], [address] Phone: [phone]   Fax:  [phone]  Physical Therapy Treatment  Patient Details  Name: Jacqueline Silva MRN: [id number] Date of Birth: 1961/03/18 Referring Provider (PT): Shelly Flatten   Encounter Date: 06/24/2020   PT End of Session - 06/24/20 1641    Visit Number 4    Number of Visits 12    Date for PT Re-Evaluation 07/26/20    Authorization Type BCBS    PT Start Time 1105    PT Stop Time 1144    PT Time Calculation (min) 39 min    Activity Tolerance Patient tolerated treatment well    Behavior During Therapy [facility] for tasks assessed/performed           Past Medical History:  Diagnosis Date  . ASCUS on Pap smear 1/97  . Hemorrhoid   . HSV-2 (herpes simplex virus 2) infection 4/05  . Osteoporosis 9/13  . Vitamin D deficiency     Past Surgical History:  Procedure Laterality Date  . BREAST BIOPSY  1986  . ESOPHAGOGASTRODUODENOSCOPY     Dr. Loreta Ave    There were no vitals filed for this visit.   Subjective Assessment - 06/24/20 1640    Subjective Pt states minimal pain, has been doing HEP    Currently in Pain? Yes    Pain Score 4     Pain Location Shoulder    Pain Orientation Left    Pain Descriptors / Indicators Aching    Pain Type Acute pain    Pain Onset More than a month ago    Pain Frequency Intermittent                             [facility] Adult PT Treatment/Exercise - 06/24/20 0001      Exercises   Exercises Shoulder      Shoulder Exercises: Supine   External Rotation AAROM;20 reps    External Rotation Limitations cane    Flexion AAROM;15 reps    Flexion Limitations cane 1 lb      Shoulder Exercises: Sidelying   External Rotation 15 reps;AROM    External Rotation Weight (lbs) 1      Shoulder Exercises: Standing   External Rotation 15 reps    Theraband Level (Shoulder External Rotation) Level 2 (Red)    Internal Rotation 15  reps    Theraband Level (Shoulder Internal Rotation) Level 2 (Red)    Flexion AROM;15 reps    Shoulder Flexion Weight (lbs) 1    Row 20 reps;Theraband    Theraband Level (Shoulder Row) Level 3 (Green)    Other Standing Exercises wall push ups x 15 ;      Shoulder Exercises: Pulleys   Flexion 3 minutes      Shoulder Exercises: ROM/Strengthening   Wall Wash --      Shoulder Exercises: Stretch   Internal Rotation Stretch Limitations --    Other Shoulder Stretches --      Manual Therapy   Manual Therapy Joint mobilization;Passive ROM    Joint Mobilization Post and Inf GHJ mobs, gr 3;   LAD    Passive ROM L shoulder, all motions                    PT Short Term Goals - 06/17/20 1239      PT SHORT TERM GOAL #1   Title Pt to be  independent with initial HEP    Time 2    Period Weeks    Status New    Target Date 06/28/20             PT Long Term Goals - 06/17/20 1239      PT LONG TERM GOAL #1   Title Pt to be independent wtih final HEP    Time 6    Period Weeks    Status New    Target Date 07/26/20      PT LONG TERM GOAL #2   Title Pt to demo improved L shoulder AROM to be WNL for improved ADLs and IADLs.    Time 6    Period Weeks    Status New    Target Date 07/26/20      PT LONG TERM GOAL #3   Title Pt to demo improved strength of L shoulder to at least 4+/5 to improve ability for reaching, lifting, carrying and IADLS.    Time 6    Period Weeks    Status New    Target Date 07/26/20      PT LONG TERM GOAL #4   Title Pt to reports pain in L shoulder to be 0-2/10 with activity and elevation.    Time 6    Period Weeks    Status New    Target Date 07/26/20                 Plan - 06/24/20 1642    Clinical Impression Statement Pt with improving ROM, and improving pain with motion. Progressing well, plan to continue work on mobility and add strengthening as tolerated.    Examination-Activity Limitations Lift;Reach Overhead;Carry;Dressing     Examination-Participation Restrictions Cleaning;Meal Prep;Yard Work;Community Activity;Shop;Laundry    Stability/Clinical Decision Making Stable/Uncomplicated    Rehab Potential Good    PT Frequency 2x / week    PT Duration 6 weeks    PT Treatment/Interventions ADLs/Self Care Home Management;Cryotherapy;Electrical Stimulation;Ultrasound;Moist Heat;Iontophoresis 4mg /ml Dexamethasone;DME Instruction;Functional mobility training;Therapeutic activities;Therapeutic exercise;Patient/family education;Neuromuscular re-education;Manual techniques;Taping;Dry needling;Passive range of motion;Spinal Manipulations;Joint Manipulations    Consulted and Agree with Plan of Care Patient           Patient will benefit from skilled therapeutic intervention in order to improve the following deficits and impairments:  Pain,Decreased mobility,Increased muscle spasms,Hypomobility,Decreased strength,Decreased range of motion,Decreased activity tolerance,Impaired flexibility  Visit Diagnosis: Acute pain of left shoulder  Stiffness of shoulder joint, left     Problem List Patient Active Problem List   Diagnosis Date Noted  . Osteoporosis 07/08/2014    07/10/2014, PT, DPT 4:42 PM  06/24/20    [facility] [address] [place], [place], [place] Phone: [phone]   Fax:  [phone]  Name: Jacqueline Silva MRN: [name] Date of Birth: 15-Oct-1960

## 2020-06-29 ENCOUNTER — Encounter: Payer: BC Managed Care – PPO | Admitting: Physical Therapy

## 2020-06-30 ENCOUNTER — Other Ambulatory Visit: Payer: Self-pay

## 2020-06-30 ENCOUNTER — Encounter: Payer: Self-pay | Admitting: Obstetrics and Gynecology

## 2020-06-30 ENCOUNTER — Ambulatory Visit (INDEPENDENT_AMBULATORY_CARE_PROVIDER_SITE_OTHER): Payer: BC Managed Care – PPO | Admitting: Physical Therapy

## 2020-06-30 ENCOUNTER — Encounter: Payer: Self-pay | Admitting: Physical Therapy

## 2020-06-30 DIAGNOSIS — M25612 Stiffness of left shoulder, not elsewhere classified: Secondary | ICD-10-CM

## 2020-06-30 DIAGNOSIS — Z1231 Encounter for screening mammogram for malignant neoplasm of breast: Secondary | ICD-10-CM | POA: Diagnosis not present

## 2020-06-30 DIAGNOSIS — M25512 Pain in left shoulder: Secondary | ICD-10-CM | POA: Diagnosis not present

## 2020-06-30 NOTE — Therapy (Signed)
[facility] [address] [place], [place], [address] Phone: [phone]   Fax:  [phone]  Physical Therapy Treatment  Patient Details  Name: Jacqueline Silva MRN: [id number] Date of Birth: Jul 28, 1960 Referring Provider (PT): Shelly Flatten   Encounter Date: 06/30/2020   PT End of Session - 06/30/20 1106    Visit Number 5    Number of Visits 12    Date for PT Re-Evaluation 07/26/20    Authorization Type BCBS    PT Start Time 1100    PT Stop Time 1139    PT Time Calculation (min) 39 min    Activity Tolerance Patient tolerated treatment well    Behavior During Therapy [facility] for tasks assessed/performed           Past Medical History:  Diagnosis Date  . ASCUS on Pap smear 1/97  . Hemorrhoid   . HSV-2 (herpes simplex virus 2) infection 4/05  . Osteoporosis 9/13  . Vitamin D deficiency     Past Surgical History:  Procedure Laterality Date  . BREAST BIOPSY  1986  . ESOPHAGOGASTRODUODENOSCOPY     Dr. Loreta Ave    There were no vitals filed for this visit.   Subjective Assessment - 06/30/20 1105    Subjective Pt states improvments.    Currently in Pain? Yes    Pain Score 4     Pain Location Shoulder    Pain Orientation Left    Pain Descriptors / Indicators Aching    Pain Type Acute pain    Pain Radiating Towards Pain with start of exercises    Pain Onset More than a month ago    Pain Frequency Intermittent              [facility] PT Assessment - 06/30/20 0001      AROM   Left Shoulder External Rotation 70 Degrees      PROM   Left Shoulder External Rotation 70 Degrees                         [facility] Adult PT Treatment/Exercise - 06/30/20 1111      Exercises   Exercises Shoulder      Shoulder Exercises: Supine   External Rotation AROM;15 reps    External Rotation Limitations at 90 deg    Flexion AAROM;15 reps    Flexion Limitations cane      Shoulder Exercises: Sidelying   External Rotation 15  reps;Strengthening    External Rotation Weight (lbs) 2      Shoulder Exercises: Standing   External Rotation 15 reps    Theraband Level (Shoulder External Rotation) Level 2 (Red)    Internal Rotation 15 reps    Theraband Level (Shoulder Internal Rotation) Level 2 (Red)    Flexion AROM;15 reps    Shoulder Flexion Weight (lbs) 1    Row 20 reps;Theraband    Theraband Level (Shoulder Row) Level 3 (Green)    Other Standing Exercises wall push ups x 15 ;      Shoulder Exercises: Pulleys   Flexion 3 minutes      Shoulder Exercises: Stretch   Internal Rotation Stretch Limitations Stick behind back:  , Ext, across back, and IR behind back x 10 each;      Manual Therapy   Manual Therapy Joint mobilization;Passive ROM    Joint Mobilization Post and Inf GHJ mobs, gr 3;   LAD    Passive ROM L shoulder, all  motions                    PT Short Term Goals - 06/17/20 1239      PT SHORT TERM GOAL #1   Title Pt to be independent with initial HEP    Time 2    Period Weeks    Status New    Target Date 06/28/20             PT Long Term Goals - 06/17/20 1239      PT LONG TERM GOAL #1   Title Pt to be independent wtih final HEP    Time 6    Period Weeks    Status New    Target Date 07/26/20      PT LONG TERM GOAL #2   Title Pt to demo improved L shoulder AROM to be WNL for improved ADLs and IADLs.    Time 6    Period Weeks    Status New    Target Date 07/26/20      PT LONG TERM GOAL #3   Title Pt to demo improved strength of L shoulder to at least 4+/5 to improve ability for reaching, lifting, carrying and IADLS.    Time 6    Period Weeks    Status New    Target Date 07/26/20      PT LONG TERM GOAL #4   Title Pt to reports pain in L shoulder to be 0-2/10 with activity and elevation.    Time 6    Period Weeks    Status New    Target Date 07/26/20                 Plan - 06/30/20 1152    Clinical Impression Statement Pt progressing well with ROM and  strength. Mild limitations and pain at end range for flexion and ER. Plan to progress as tolerated for full ROM and strength.    Examination-Activity Limitations Lift;Reach Overhead;Carry;Dressing    Examination-Participation Restrictions Cleaning;Meal Prep;Yard Work;Community Activity;Shop;Laundry    Stability/Clinical Decision Making Stable/Uncomplicated    Rehab Potential Good    PT Frequency 2x / week    PT Duration 6 weeks    PT Treatment/Interventions ADLs/Self Care Home Management;Cryotherapy;Electrical Stimulation;Ultrasound;Moist Heat;Iontophoresis 4mg /ml Dexamethasone;DME Instruction;Functional mobility training;Therapeutic activities;Therapeutic exercise;Patient/family education;Neuromuscular re-education;Manual techniques;Taping;Dry needling;Passive range of motion;Spinal Manipulations;Joint Manipulations    Consulted and Agree with Plan of Care Patient           Patient will benefit from skilled therapeutic intervention in order to improve the following deficits and impairments:  Pain,Decreased mobility,Increased muscle spasms,Hypomobility,Decreased strength,Decreased range of motion,Decreased activity tolerance,Impaired flexibility  Visit Diagnosis: Acute pain of left shoulder  Stiffness of shoulder joint, left     Problem List Patient Active Problem List   Diagnosis Date Noted  . Osteoporosis 07/08/2014    07/10/2014, PT, DPT 11:53 AM  06/30/20    [facility] [address] [place], [place], [place] Phone: [phone]   Fax:  [phone]  Name: Jacqueline Silva MRN: [name] Date of Birth: [birth year]

## 2020-07-06 ENCOUNTER — Encounter: Payer: Self-pay | Admitting: Physical Therapy

## 2020-07-06 ENCOUNTER — Other Ambulatory Visit: Payer: Self-pay

## 2020-07-06 ENCOUNTER — Ambulatory Visit: Payer: BC Managed Care – PPO | Admitting: Physical Therapy

## 2020-07-06 DIAGNOSIS — M25612 Stiffness of left shoulder, not elsewhere classified: Secondary | ICD-10-CM

## 2020-07-06 DIAGNOSIS — M25512 Pain in left shoulder: Secondary | ICD-10-CM | POA: Diagnosis not present

## 2020-07-06 NOTE — Therapy (Signed)
[facility] [address] [place], [place], [address] Phone: [phone]   Fax:  [phone]  Physical Therapy Treatment  Patient Details  Name: Jacqueline Silva MRN: [id number] Date of Birth: [birth year] Referring Provider (PT): Jacqueline Silva   Encounter Date: 07/06/2020   PT End of Session - 07/06/20 0902    Visit Number 6    Number of Visits 12    Date for PT Re-Evaluation 07/26/20    Authorization Type BCBS    PT Start Time 0857    PT Stop Time 0931    PT Time Calculation (min) 34 min    Activity Tolerance Patient tolerated treatment well    Behavior During Therapy Jacqueline Silva [facility] for tasks assessed/performed           Past Medical History:  Diagnosis Date  . ASCUS on Pap smear 1/97  . Hemorrhoid   . HSV-2 (herpes simplex virus 2) infection 4/05  . Osteoporosis 9/13  . Vitamin D deficiency     Past Surgical History:  Procedure Laterality Date  . BREAST BIOPSY  1986  . ESOPHAGOGASTRODUODENOSCOPY     Dr. Loreta Silva    There were no vitals filed for this visit.   Subjective Assessment - 07/06/20 0901    Subjective Pt states good improvments with showering, dressing and reaching. ONly states pain with exercises, End ranges of fleixon , IR, ER.    Currently in Pain? Yes    Pain Score 4     Pain Location Shoulder    Pain Orientation Left    Pain Descriptors / Indicators Aching    Pain Type Acute pain    Pain Onset More than a month ago    Pain Frequency Intermittent                             [facility] Adult PT Treatment/Exercise - 07/06/20 0903      Exercises   Exercises Shoulder      Shoulder Exercises: Supine   External Rotation AROM;15 reps    External Rotation Limitations cane at 90 deg    Flexion AAROM;15 reps    Flexion Limitations cane      Shoulder Exercises: Sidelying   External Rotation 15 reps;Strengthening    External Rotation Weight (lbs) 2      Shoulder Exercises: Standing   External  Rotation 15 reps    Theraband Level (Shoulder External Rotation) Level 2 (Red)    Internal Rotation 15 reps    Theraband Level (Shoulder Internal Rotation) Level 2 (Red)    Flexion AROM;15 reps    Shoulder Flexion Weight (lbs) 1    Row 20 reps;Theraband    Theraband Level (Shoulder Row) Level 3 (Green)    Other Standing Exercises wall push ups x 15 ;      Shoulder Exercises: Pulleys   Flexion 3 minutes      Shoulder Exercises: Stretch   Internal Rotation Stretch Limitations --    Other Shoulder Stretches ER butterfly supine x10 3 sec holds      Manual Therapy   Manual Therapy Joint mobilization;Passive ROM    Joint Mobilization Post and Inf GHJ mobs, gr 3;   LAD    Passive ROM L shoulder, all motions                  PT Education - 07/06/20 0902    Education Details Reviewed HEP    Person(s) Educated Patient  Methods Explanation;Demonstration;Verbal cues    Comprehension Returned demonstration;Verbal cues required;Verbalized understanding            PT Short Term Goals - 06/17/20 1239      PT SHORT TERM GOAL #1   Title Pt to be independent with initial HEP    Time 2    Period Weeks    Status New    Target Date 06/28/20             PT Long Term Goals - 06/17/20 1239      PT LONG TERM GOAL #1   Title Pt to be independent wtih final HEP    Time 6    Period Weeks    Status New    Target Date 07/26/20      PT LONG TERM GOAL #2   Title Pt to demo improved L shoulder AROM to be WNL for improved ADLs and IADLs.    Time 6    Period Weeks    Status New    Target Date 07/26/20      PT LONG TERM GOAL #3   Title Pt to demo improved strength of L shoulder to at least 4+/5 to improve ability for reaching, lifting, carrying and IADLS.    Time 6    Period Weeks    Status New    Target Date 07/26/20      PT LONG TERM GOAL #4   Title Pt to reports pain in L shoulder to be 0-2/10 with activity and elevation.    Time 6    Period Weeks    Status New     Target Date 07/26/20                 Plan - 07/06/20 0930    Clinical Impression Statement Pt showing very good progress. Near full PROM for flex and ER today. Updated HEP for increased strengthening, will focus more on strengthening due to ROM improving. Still has limitation and pain at end ranges for Er and IR behind back. Plan to progress as tolerated.    Examination-Activity Limitations Lift;Reach Overhead;Carry;Dressing    Examination-Participation Restrictions Cleaning;Meal Prep;Yard Work;Community Activity;Shop;Laundry    Stability/Clinical Decision Making Stable/Uncomplicated    Rehab Potential Good    PT Frequency 2x / week    PT Duration 6 weeks    PT Treatment/Interventions ADLs/Self Care Home Management;Cryotherapy;Electrical Stimulation;Ultrasound;Moist Heat;Iontophoresis 4mg /ml Dexamethasone;DME Instruction;Functional mobility training;Therapeutic activities;Therapeutic exercise;Patient/family education;Neuromuscular re-education;Manual techniques;Taping;Dry needling;Passive range of motion;Spinal Manipulations;Joint Manipulations    Consulted and Agree with Plan of Care Patient           Patient will benefit from skilled therapeutic intervention in order to improve the following deficits and impairments:  Pain,Decreased mobility,Increased muscle spasms,Hypomobility,Decreased strength,Decreased range of motion,Decreased activity tolerance,Impaired flexibility  Visit Diagnosis: Acute pain of left shoulder  Stiffness of shoulder joint, left     Problem List Patient Active Problem List   Diagnosis Date Noted  . Osteoporosis 07/08/2014    07/10/2014, PT, DPT 10:43 AM  07/06/20    [facility] [address] [place], [place], [place] Phone: [phone]   Fax:  [phone]  Name: Jacqueline Silva MRN: Jacqueline Silva Date of Birth: Jacqueline Silva

## 2020-07-08 ENCOUNTER — Other Ambulatory Visit: Payer: Self-pay

## 2020-07-08 ENCOUNTER — Ambulatory Visit: Payer: BC Managed Care – PPO | Admitting: Physical Therapy

## 2020-07-08 ENCOUNTER — Encounter: Payer: Self-pay | Admitting: Physical Therapy

## 2020-07-08 DIAGNOSIS — M25512 Pain in left shoulder: Secondary | ICD-10-CM | POA: Diagnosis not present

## 2020-07-08 DIAGNOSIS — M25612 Stiffness of left shoulder, not elsewhere classified: Secondary | ICD-10-CM | POA: Diagnosis not present

## 2020-07-08 NOTE — Patient Instructions (Signed)
Access Code: [email or web address] URL: [email or web address] Date: 07/08/2020 Prepared by: Sedalia Muta  Exercises Supine Shoulder Flexion with Dowel - 2 x daily - 1 sets - 10 reps - 5 hold Supine Shoulder External Rotation with Dowel - 1 x daily - 2 sets - 10 reps - 5 hold Supine Chest Stretch with Elbows Bent - 2 x daily - 1 sets - 10 reps - 10 hold Supine Shoulder External Rotation Stretch - 1 x daily - 2 sets - 10 reps - 5 hold Standing Shoulder Internal Rotation Stretch with Hands Behind Back - 2 x daily - 1 sets - 5-10 reps - 10 hold Doorway Pec Stretch at 60 Elevation - 2 x daily - 3 reps - 30 hold Shoulder External Rotation with Anchored Resistance - 1 x daily - 2 sets - 10 reps Shoulder Internal Rotation with Resistance - 1 x daily - 2 sets - 10 reps Standing Row with Anchored Resistance - 1 x daily - 2 sets - 10 reps

## 2020-07-12 ENCOUNTER — Encounter: Payer: Self-pay | Admitting: Physical Therapy

## 2020-07-12 NOTE — Therapy (Signed)
[place], [place], [address] Phone: [phone]   Fax:  [phone]  Physical Therapy Treatment/Discharge   Patient Details  Name: Jacqueline Silva MRN: [id number] Date of Birth: 11-16-1960 Referring Provider (PT): Linna Darner   Encounter Date: 07/08/2020   PT End of Session - 07/12/20 1044    Visit Number 7    Number of Visits 12    Date for PT Re-Evaluation 07/26/20    Authorization Type BCBS    PT Start Time 1433    PT Stop Time 1513    PT Time Calculation (min) 40 min    Activity Tolerance Patient tolerated treatment well    Behavior During Therapy [facility] for tasks assessed/performed           Past Medical History:  Diagnosis Date  . ASCUS on Pap smear 1/97  . Hemorrhoid   . HSV-2 (herpes simplex virus 2) infection 4/05  . Osteoporosis 9/13  . Vitamin D deficiency     Past Surgical History:  Procedure Laterality Date  . BREAST BIOPSY  1986  . ESOPHAGOGASTRODUODENOSCOPY     Dr. Collene Mares    There were no vitals filed for this visit.   Subjective Assessment - 07/12/20 1043    Subjective Pt states doing very well. Only feels mild limitation with IR behind the back.    Currently in Pain? No/denies                             [facility] Adult PT Treatment/Exercise - 07/12/20 0001      Exercises   Exercises Shoulder      Shoulder Exercises: Supine   External Rotation AROM;15 reps    External Rotation Limitations at 90 deg    Flexion AAROM;15 reps    Flexion Limitations cane    Other Supine Exercises Flexion AROM x 10;      Shoulder Exercises: Sidelying   External Rotation 15 reps;Strengthening    External Rotation Weight (lbs) 2      Shoulder Exercises: Standing   External Rotation 15 reps    Theraband Level (Shoulder External Rotation) Level 2 (Red)    Internal Rotation 15 reps    Theraband Level (Shoulder Internal Rotation) Level 2 (Red)    Flexion AROM;15 reps     Shoulder Flexion Weight (lbs) 2    Row 20 reps;Theraband    Theraband Level (Shoulder Row) Level 3 (Green)    Other Standing Exercises wall push ups x 15 ;      Shoulder Exercises: Pulleys   Flexion 3 minutes      Shoulder Exercises: Stretch   Corner Stretch 3 reps;30 seconds    Corner Stretch Limitations doorway at 60 and 90 deg;    Internal Rotation Stretch Limitations Stick behind back:  , Ext, across back, and IR behind back x 10 each;      Manual Therapy   Manual Therapy Joint mobilization;Passive ROM    Joint Mobilization Post and Inf GHJ mobs, gr 3;   LAD    Passive ROM L shoulder, all motions                  PT Education - 07/12/20 1043    Education Details Final HEP reviewed    Person(s) Educated Patient    Methods Explanation;Demonstration;Verbal cues;Handout    Comprehension Verbalized understanding;Returned demonstration;Verbal cues required  PT Short Term Goals - 07/12/20 1044      PT SHORT TERM GOAL #1   Title Pt to be independent with initial HEP    Time 2    Period Weeks    Status Achieved    Target Date 06/28/20             PT Long Term Goals - 07/12/20 1044      PT LONG TERM GOAL #1   Title Pt to be independent wtih final HEP    Time 6    Period Weeks    Status Achieved      PT LONG TERM GOAL #2   Title Pt to demo improved L shoulder AROM to be WNL for improved ADLs and IADLs.    Time 6    Period Weeks    Status Achieved      PT LONG TERM GOAL #3   Title Pt to demo improved strength of L shoulder to at least 4+/5 to improve ability for reaching, lifting, carrying and IADLS.    Time 6    Period Weeks    Status Achieved      PT LONG TERM GOAL #4   Title Pt to reports pain in L shoulder to be 0-2/10 with activity and elevation.    Time 6    Period Weeks    Status Achieved                 Plan - 07/12/20 1047    Clinical Impression Statement Pt doing very well, has made very good progress. She has met  goals, and is ready for d/c at this time. She has very mild limitation for behind the back IR, but has made good improvments in ROM, strength, and pain with other motions. Final HEP reviewed in detail today.    Examination-Activity Limitations Lift;Reach Overhead;Carry;Dressing    Examination-Participation Restrictions Cleaning;Meal Prep;Yard Work;Community Activity;Shop;Laundry    PT Treatment/Interventions ADLs/Self Care Home Management;Cryotherapy;Electrical Stimulation;Ultrasound;Moist Heat;Iontophoresis 29m/ml Dexamethasone;DME Instruction;Functional mobility training;Therapeutic activities;Therapeutic exercise;Patient/family education;Neuromuscular re-education;Manual techniques;Taping;Dry needling;Passive range of motion;Spinal Manipulations;Joint Manipulations    Consulted and Agree with Plan of Care Patient           Patient will benefit from skilled therapeutic intervention in order to improve the following deficits and impairments:  Pain,Decreased mobility,Increased muscle spasms,Hypomobility,Decreased strength,Decreased range of motion,Decreased activity tolerance,Impaired flexibility  Visit Diagnosis: Acute pain of left shoulder  Stiffness of shoulder joint, left     Problem List Patient Active Problem List   Diagnosis Date Noted  . Osteoporosis 07/08/2014   LLyndee Hensen PT, DPT 10:52 AM  07/12/20    [place] [address] [address]Phone: [phone]  Fax:  [phone] Name: Jacqueline HANNISMRN: [id number] of Birth: [birth year]   PHYSICAL THERAPY DISCHARGE SUMMARY  Visits from Start of Care:7  Plan: Patient agrees to discharge.  Patient goals were met. Patient is being discharged due to meeting the stated rehab goals.  ?????    LLyndee Hensen PT, DPT 10:52 AM  07/12/20

## 2020-07-27 ENCOUNTER — Other Ambulatory Visit: Payer: BC Managed Care – PPO

## 2020-08-27 ENCOUNTER — Encounter: Payer: Self-pay | Admitting: Obstetrics and Gynecology

## 2020-09-26 ENCOUNTER — Telehealth: Payer: Self-pay | Admitting: Obstetrics and Gynecology

## 2020-09-26 NOTE — Telephone Encounter (Signed)
Please let the patient know that her DEXA shows osteoporosis and set her up for an appointment to discuss.

## 2020-09-27 NOTE — Telephone Encounter (Signed)
Left message for patient to call.

## 2020-09-28 NOTE — Telephone Encounter (Signed)
Patient called. I spoke with her and relayed Dr Salli Quarry note.  Staff message was sent to Toniann Fail to call and schedule patient's appt. Patient is aware that appt desk will call her.

## 2020-09-29 ENCOUNTER — Encounter: Payer: Self-pay | Admitting: Obstetrics and Gynecology

## 2020-09-29 NOTE — Telephone Encounter (Signed)
09/26/20   Please let the patient know that her DEXA shows osteoporosis and set her up for an appointment to discuss.      Appt scheduled for 11/03/20.

## 2020-11-03 ENCOUNTER — Encounter: Payer: Self-pay | Admitting: Obstetrics and Gynecology

## 2020-11-03 ENCOUNTER — Ambulatory Visit: Payer: 59 | Admitting: Obstetrics and Gynecology

## 2020-11-03 ENCOUNTER — Other Ambulatory Visit: Payer: Self-pay

## 2020-11-03 VITALS — BP 124/68 | HR 71 | Ht 64.5 in | Wt 150.0 lb

## 2020-11-03 DIAGNOSIS — M818 Other osteoporosis without current pathological fracture: Secondary | ICD-10-CM

## 2020-11-03 LAB — VITAMIN D 25 HYDROXY (VIT D DEFICIENCY, FRACTURES): Vit D, 25-Hydroxy: 33 ng/mL (ref 30–100)

## 2020-11-03 NOTE — Progress Notes (Signed)
GYNECOLOGY  VISIT   HPI: 60 y.o.   Married White or Caucasian Not Hispanic or Latino  female   G1P1001 with Patient's last menstrual period was 01/26/2013.   here for dexa scan consult.   Review of DEXA scans from 2015, 2017, 2019 and 3/22 done. Chart reviewed.   In 2015 her T score in her spine had improved from -2.9 to - 2.5 on Fosamax. She was continued on Fosamax.   In November, 2017 she had a T score change in her spine from -2.5 to -3.1 while still taking fosamax. She was seen by Dr Hyacinth Meeker in 12/17 had had more of a work up.  Normal PTH and calcium Normal 24 hour urine calcium In 4/18 she was switched from Fosamax to Prolia.  DEXA from 06/24/18 T score in her spine improved from -3.1 to -2.1 while on the prolia. At the same time she had a -5% change in her hip.  The patient was transitioned from Prolia back to Fosamax in  January, 2020  DEXA from 09/13/20 T score of -2.6 in her spine, 7% decrease from her prior DEXA on 06/24/18. There was a -1% change in her right femur and a +5% change in her left femur.    She takes one 600 mg tablet a day of calcium with vit D. She exercises 3-4 days a week, walks. Drinks 2 oz of ETOH 2 x a month Non smoker.  Reviewed her lab work from last May (scanned in chart). They check a CBC, CMP, TSH, Phosphorous every May  GYNECOLOGIC HISTORY: Patient's last menstrual period was 01/26/2013. Contraception:PMP Menopausal hormone therapy: estradiol         OB History    Gravida  1   Para  1   Term  1   Preterm      AB      Living  1     SAB      IAB      Ectopic      Multiple      Live Births                 Patient Active Problem List   Diagnosis Date Noted  . Osteoporosis 07/08/2014    Past Medical History:  Diagnosis Date  . ASCUS on Pap smear 1/97  . Hemorrhoid   . HSV-2 (herpes simplex virus 2) infection 4/05  . Osteoporosis 9/13  . Vitamin D deficiency     Past Surgical History:  Procedure Laterality  Date  . BREAST BIOPSY  1986  . ESOPHAGOGASTRODUODENOSCOPY     Dr. Loreta Ave    Current Outpatient Medications  Medication Sig Dispense Refill  . alendronate (FOSAMAX) 70 MG tablet TAKE 1 TABLET EVERY 7 DAYS.TAKE WITH A FULL GLASS OF  WATER ON AN EMPTY STOMACH 12 tablet 3  . Aspirin-Acetaminophen-Caffeine (EXCEDRIN MIGRAINE PO) Take by mouth as needed.    . Estradiol 10 MCG TABS vaginal tablet Place 1 tablet (10 mcg total) vaginally 2 (two) times a week. 24 tablet 1  . Naproxen Sodium (ALEVE PO) Take by mouth as needed.    . nitrofurantoin, macrocrystal-monohydrate, (MACROBID) 100 MG capsule Take one tablet orally after intercourse. 30 capsule 1  . valACYclovir (VALTREX) 500 MG tablet TAKE 1 TABLET BY MOUTH TWICE DAILY FOR 3 DAYS WITH SYMPTOMS THEN EVERY DAY FOR SUPPRESSION 30 tablet 2   No current facility-administered medications for this visit.     ALLERGIES: Patient has no known allergies.  History reviewed.  No pertinent family history.  Social History   Socioeconomic History  . Marital status: Married    Spouse name: Not on file  . Number of children: Not on file  . Years of education: Not on file  . Highest education level: Not on file  Occupational History  . Not on file  Tobacco Use  . Smoking status: Never Smoker  . Smokeless tobacco: Never Used  Vaping Use  . Vaping Use: Never used  Substance and Sexual Activity  . Alcohol use: Yes    Alcohol/week: 1.0 standard drink    Types: 1 Standard drinks or equivalent per week  . Drug use: No  . Sexual activity: Yes    Partners: Male    Birth control/protection: Post-menopausal  Other Topics Concern  . Not on file  Social History Narrative  . Not on file   Social Determinants of Health   Financial Resource Strain: Not on file  Food Insecurity: Not on file  Transportation Needs: Not on file  Physical Activity: Not on file  Stress: Not on file  Social Connections: Not on file  Intimate Partner Violence: Not on file     Review of Systems  All other systems reviewed and are negative.   PHYSICAL EXAMINATION:    BP 124/68   Pulse 71   Ht 5' 4.5" (1.638 m)   Wt 150 lb (68 kg)   LMP 01/26/2013   SpO2 99%   BMI 25.35 kg/m     General appearance: alert, cooperative and appears stated age  50. Age-related osteoporosis without fracture Reviewed her last several bone density tests. She initially had improvement on Fosamax in 2015, then worsening in her T score in 2017 while still on Fosamax. She was switched to Prolia with improvement in her spine (some decrease in her hip). She was switched back to Fosamax in 2020 and again has a decrease in her T score. This raises concerns for poor absorption - VITAMIN D 25 Hydroxy (Vit-D Deficiency, Fractures) -she is getting the rest of her lab work from work and will bring it in next month at the time of her annual exam -Recommend that she switch to Reclast or Prolia. Discussed risks and side effects. Discussed the increased risk of vertebral fractures if she stops Prolia and that if she restarts Prolia she will likely need to stay on it. -Would recommend repeat DEXA one year after starting the new medication  -Patient information given from Up To Date on treatment for osteoporosis  Well over 30 minutes spent in total patient care.

## 2020-11-15 NOTE — Progress Notes (Signed)
60 y.o. G26P1001 Married White or Caucasian Not Hispanic or Latino female here for annual exam.   She reports an occasional mild discharge and occasional itch in the last year. Just comes and goes, doesn't feel like she has a vaginitis. No vaginal bleeding. Very infrequently sexually active, it was painful in the past. She started vaginal estrogen tablets last year, the last time she had sex it was much better.   No bowel or bladder c/o.   She was seen last month to discuss her DEXA: Review of DEXA scans from 2015, 2017, 2019 and 3/22 done. Chart reviewed.   In 2015 her T score in her spine had improved from -2.9 to - 2.5 on Fosamax. She was continued on Fosamax.   In November, 2017 she had a T score change in her spine from -2.5 to -3.1 while still taking fosamax. She was seen by Dr Hyacinth Meeker in 12/17 had had more of a work up.  Normal PTH and calcium Normal 24 hour urine calcium In 4/18 she was switched from Fosamax to Prolia.  DEXA from 06/24/18 T score in her spine improved from -3.1 to -2.1 while on the prolia. At the same time she had a -5% change in her hip.  The patient was transitioned from Prolia back to Fosamax in  January, 2020  DEXA from 09/13/20 T score of -2.6 in her spine, 7% decrease from her prior DEXA on 06/24/18. There was a -1% change in her right femur and a +5% change in her left femur.    She takes one 600 mg tablet a day of calcium with vit D. She exercises 3-4 days a week, walks. Drinks 2 oz of ETOH 2 x a month Non smoker.  Her vit D from last month was 33, labs from her primary show a normal creatinine, calcium, phosphorous and CBC    She would like to try the Reclast.  Patient's last menstrual period was 01/26/2013.          Sexually active: Yes.  The current method of family planning is post menopausal status.   Exercising: Yes. wallking Smoker:  No.  Health Maintenance: Pap: 05-19-15 neg HPV HR neg, 07-16-2018 neg HPV HR neg  History of abnormal  Pap: No MMG: 06/30/20 category b density, BiRads 2: neg BMD:  09/13/20 on Fosamax  Colonoscopy: 2020 f/u 10 yrs TDaP:  2017 Hep C: 2016 Neg.   reports that she has never smoked. She has never used smokeless tobacco. She reports current alcohol use of about 1.0 standard drink of alcohol per week. She reports that she does not use drugs. 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 Daughter is single and living in [place], [place].   Past Medical History:  Diagnosis Date  . ASCUS on Pap smear 1/97  . Hemorrhoid   . HSV-2 (herpes simplex virus 2) infection 4/05  . Osteoporosis 9/13  . Vitamin D deficiency     Past Surgical History:  Procedure Laterality Date  . BREAST BIOPSY  1986  . ESOPHAGOGASTRODUODENOSCOPY     Dr. Loreta Ave    Current Outpatient Medications  Medication Sig Dispense Refill  . alendronate (FOSAMAX) 70 MG tablet TAKE 1 TABLET EVERY 7 DAYS.TAKE WITH A FULL GLASS OF  WATER ON AN EMPTY STOMACH 12 tablet 3  . Aspirin-Acetaminophen-Caffeine (EXCEDRIN MIGRAINE PO) Take by mouth as needed.    . Estradiol 10 MCG TABS vaginal tablet Place 1 tablet (10 mcg total) vaginally 2 (two)  times a week. 24 tablet 1  . Naproxen Sodium (ALEVE PO) Take by mouth as needed.    . nitrofurantoin, macrocrystal-monohydrate, (MACROBID) 100 MG capsule Take one tablet orally after intercourse. 30 capsule 1  . valACYclovir (VALTREX) 500 MG tablet TAKE 1 TABLET BY MOUTH TWICE DAILY FOR 3 DAYS WITH SYMPTOMS THEN EVERY DAY FOR SUPPRESSION 30 tablet 2   No current facility-administered medications for this visit.    No family history on file.  Review of Systems  Constitutional: Negative.   Eyes: Negative.   Respiratory: Negative.   Cardiovascular: Negative.   Gastrointestinal: Negative.   Endocrine: Negative.   Genitourinary: Negative.   Musculoskeletal: Negative.   Skin: Negative.   Allergic/Immunologic: Negative.   Neurological: Negative.   Hematological: Negative.    Psychiatric/Behavioral: Negative.     Exam:   LMP 01/26/2013   Weight change: @WEIGHTCHANGE @ Height:      Ht Readings from Last 3 Encounters:  11/03/20 5' 4.5" (1.638 m)  11/20/19 5' 4.75" (1.645 m)  07/16/18 5' 3.5" (1.613 m)    General appearance: alert, cooperative and appears stated age Head: Normocephalic, without obvious abnormality, atraumatic Neck: no adenopathy, supple, symmetrical, trachea midline and thyroid normal to inspection and palpation Lungs: clear to auscultation bilaterally Cardiovascular: regular rate and rhythm Breasts: normal appearance, no masses or tenderness Abdomen: soft, non-tender; non distended,  no masses,  no organomegaly Extremities: extremities normal, atraumatic, no cyanosis or edema Skin: Skin color, texture, turgor normal. No rashes or lesions Lymph nodes: Cervical, supraclavicular, and axillary nodes normal. No abnormal inguinal nodes palpated Neurologic: Grossly normal   Pelvic: External genitalia:  no lesions              Urethra:  normal appearing urethra with no masses, tenderness or lesions              Bartholins and Skenes: normal                 Vagina: mildly atrophic appearing vagina, no abnormal discharge noted              Cervix: no lesions               Bimanual Exam:  Uterus:  normal size, contour, position, consistency, mobility, non-tender              Adnexa: no mass, fullness, tenderness               Rectovaginal: Confirms               Anus:  normal sphincter tone, no lesions  09/14/18 chaperoned for the exam.   1. Well woman exam Discussed breast self exam Discussed calcium and vit D intake Mammogram and colonoscopy UTD Labs with primary (reviewed)  2. History of osteoporosis DEXA declining on Fosamax She has been counseled  Will order reclast and repeat the DEXA one year later  3. Vaginal atrophy - Estradiol 10 MCG TABS vaginal tablet; Place 1 tablet (10 mcg total) vaginally 2 (two) times a week.   Dispense: 24 tablet; Refill: 3  4. Herpes simplex infection of genitourinary system - valACYclovir (VALTREX) 500 MG tablet; TAKE 1 TABLET BY MOUTH TWICE DAILY FOR 3 DAYS WITH SYMPTOMS  Dispense: 30 tablet; Refill: 2

## 2020-11-23 ENCOUNTER — Encounter: Payer: Self-pay | Admitting: Obstetrics and Gynecology

## 2020-11-23 NOTE — Telephone Encounter (Signed)
AEX scheduled 11/25/20 with Dr. Oscar La.

## 2020-11-25 ENCOUNTER — Other Ambulatory Visit: Payer: Self-pay

## 2020-11-25 ENCOUNTER — Encounter: Payer: Self-pay | Admitting: Obstetrics and Gynecology

## 2020-11-25 ENCOUNTER — Ambulatory Visit (INDEPENDENT_AMBULATORY_CARE_PROVIDER_SITE_OTHER): Payer: 59 | Admitting: Obstetrics and Gynecology

## 2020-11-25 VITALS — BP 120/70 | HR 68 | Ht 63.25 in | Wt 144.0 lb

## 2020-11-25 DIAGNOSIS — Z8739 Personal history of other diseases of the musculoskeletal system and connective tissue: Secondary | ICD-10-CM | POA: Diagnosis not present

## 2020-11-25 DIAGNOSIS — N952 Postmenopausal atrophic vaginitis: Secondary | ICD-10-CM

## 2020-11-25 DIAGNOSIS — Z01419 Encounter for gynecological examination (general) (routine) without abnormal findings: Secondary | ICD-10-CM

## 2020-11-25 DIAGNOSIS — A6000 Herpesviral infection of urogenital system, unspecified: Secondary | ICD-10-CM | POA: Diagnosis not present

## 2020-11-25 MED ORDER — VALACYCLOVIR HCL 500 MG PO TABS: ORAL_TABLET | ORAL | 2 refills | Status: DC

## 2020-11-25 MED ORDER — ESTRADIOL 10 MCG VA TABS: 1.0000 | ORAL_TABLET | VAGINAL | 3 refills | Status: DC

## 2020-11-25 NOTE — Patient Instructions (Signed)

## 2020-11-29 ENCOUNTER — Telehealth: Payer: Self-pay | Admitting: *Deleted

## 2020-11-29 NOTE — Telephone Encounter (Addendum)
Recast instructions   Annual Exam (1 year) 11/25/2020    Called pt to verify insurance coverage / inform pt Reclast is in Process ?  Labs must be in 30 days window Serum Creatinine 0.81  DATE 11/12/2020 Serum Calcium 9.2 DATE 11/12/20   PA needed? NO just send in Medical records to [phone]  deductible $500 ($[phone])  Cost for Pt? $283   Order form filled out and faxed  w/MD sig?  [facility] at [facility]  Pt aware? YES  Pt would like an afternoon appt next week. Pt is on vacation this week appt 12/09/2020 Instructions mailed to pt? yes   APPT 12/09/2020

## 2020-11-29 NOTE — Telephone Encounter (Signed)
-----   Message from Jill Evelyn Jertson, MD sent at 11/25/2020  8:40 AM EDT ----- This patient has osteoporosis. Please set her up for Reclast (labs UTD). She will need a DEXA one year after starting the Reclast (DEXA worsened on Fosamax). Thanks, Jill  

## 2020-11-29 NOTE — Telephone Encounter (Signed)
error 

## 2020-11-29 NOTE — Telephone Encounter (Signed)
-----   Message from Romualdo Bolk, MD sent at 11/25/2020  8:40 AM EDT ----- This patient has osteoporosis. Please set her up for Reclast (labs UTD). She will need a DEXA one year after starting the Reclast (DEXA worsened on Fosamax). Thanks, Noreene Larsson

## 2020-12-02 ENCOUNTER — Other Ambulatory Visit: Payer: Self-pay | Admitting: Obstetrics and Gynecology

## 2020-12-02 DIAGNOSIS — N952 Postmenopausal atrophic vaginitis: Secondary | ICD-10-CM

## 2020-12-03 NOTE — Telephone Encounter (Signed)
I called [facility] to see if they received Rx on 11/25/20. They did not have Rx from this date. I called Rx with pharmacist

## 2020-12-08 ENCOUNTER — Other Ambulatory Visit (HOSPITAL_COMMUNITY): Payer: Self-pay

## 2020-12-08 NOTE — Discharge Instructions (Signed)
Zoledronic Acid Injection (Paget's Disease, Osteoporosis) What is this medicine? ZOLEDRONIC ACID (ZOE le dron ik AS id) slows calcium loss from bones. It treats Paget's disease and osteoporosis. It may be used in other people at risk for bone loss. This medicine may be used for other purposes; ask your health care provider or pharmacist if you have questions. COMMON BRAND NAME(S): Reclast, Zometa What should I tell my health care provider before I take this medicine? They need to know if you have any of these conditions:  bleeding disorder  cancer  dental disease  kidney disease  low levels of calcium in the blood  low red blood cell counts  lung or breathing disease (asthma)  receiving steroids like dexamethasone or prednisone  an unusual or allergic reaction to zoledronic acid, other medicines, foods, dyes, or preservatives  pregnant or trying to get pregnant  breast-feeding How should I use this medicine? This drug is injected into a vein. It is given by a health care provider in a hospital or clinic setting. A special MedGuide will be given to you before each treatment. Be sure to read this information carefully each time. Talk to your health care provider about the use of this drug in children. Special care may be needed. Overdosage: If you think you have taken too much of this medicine contact a poison control center or emergency room at once. NOTE: This medicine is only for you. Do not share this medicine with others. What if I miss a dose? Keep appointments for follow-up doses. It is important not to miss your dose. Call your health care provider if you are unable to keep an appointment. What may interact with this medicine?  certain antibiotics given by injection  NSAIDs, medicines for pain and inflammation, like ibuprofen or naproxen  some diuretics like bumetanide, furosemide  teriparatide This list may not describe all possible interactions. Give your health  care provider a list of all the medicines, herbs, non-prescription drugs, or dietary supplements you use. Also tell them if you smoke, drink alcohol, or use illegal drugs. Some items may interact with your medicine. What should I watch for while using this medicine? Visit your health care provider for regular checks on your progress. It may be some time before you see the benefit from this drug. Some people who take this drug have severe bone, joint, or muscle pain. This drug may also increase your risk for jaw problems or a broken thigh bone. Tell your health care provider right away if you have severe pain in your jaw, bones, joints, or muscles. Tell you health care provider if you have any pain that does not go away or that gets worse. You should make sure you get enough calcium and vitamin D while you are taking this drug. Discuss the foods you eat and the vitamins you take with your health care provider. You may need blood work done while you are taking this drug. Tell your dentist and dental surgeon that you are taking this drug. You should not have major dental surgery while on this drug. See your dentist to have a dental exam and fix any dental problems before starting this drug. Take good care of your teeth while on this drug. Make sure you see your dentist for regular follow-up appointments. What side effects may I notice from receiving this medicine? Side effects that you should report to your doctor or health care provider as soon as possible:  allergic reactions (skin rash, itching or   hives; swelling of the face, lips, or tongue)  bone pain  infection (fever, chills, cough, sore throat, pain or trouble passing urine)  jaw pain, especially after dental work  joint pain  kidney injury (trouble passing urine or change in the amount of urine)  low calcium levels (fast heartbeat; muscle cramps or pain; pain, tingling, or numbness in the hands or feet; seizures)  low red blood cell  counts (trouble breathing; feeling faint; lightheaded, falls; unusually weak or tired)  muscle pain  palpitations  redness, blistering, peeling, or loosening of the skin, including inside the mouth Side effects that usually do not require medical attention (report to your doctor or health care provider if they continue or are bothersome):  diarrhea  eye irritation, itching, or pain  fever  general ill feeling or flu-like symptoms  headache  increase in blood pressure  nausea  pain, redness, or irritation at site where injected  stomach pain  upset stomach This list may not describe all possible side effects. Call your doctor for medical advice about side effects. You may report side effects to FDA at 1-800-FDA-1088. Where should I keep my medicine? This drug is given in a hospital or clinic. It will not be stored at home. NOTE: This sheet is a summary. It may not cover all possible information. If you have questions about this medicine, talk to your doctor, pharmacist, or health care provider.  2021 Elsevier/Gold Standard (2019-04-14 11:46:18)   

## 2020-12-09 ENCOUNTER — Ambulatory Visit (HOSPITAL_COMMUNITY)
Admission: RE | Admit: 2020-12-09 | Discharge: 2020-12-09 | Disposition: A | Discharge status: Home / Self Care | Payer: 59 | Source: Ambulatory Visit | Attending: Obstetrics and Gynecology | Admitting: Obstetrics and Gynecology

## 2020-12-09 ENCOUNTER — Other Ambulatory Visit: Payer: Self-pay

## 2020-12-09 DIAGNOSIS — M810 Age-related osteoporosis without current pathological fracture: Secondary | ICD-10-CM | POA: Insufficient documentation

## 2020-12-09 MED ORDER — ZOLEDRONIC ACID 5 MG/100ML IV SOLN
INTRAVENOUS | Status: AC
  Administered 2020-12-09: 5 mg via INTRAVENOUS
  Filled 2020-12-09: qty 100

## 2020-12-09 MED ORDER — ZOLEDRONIC ACID 5 MG/100ML IV SOLN: 5.0000 mg | Freq: Once | INTRAVENOUS | Status: AC

## 2020-12-10 ENCOUNTER — Ambulatory Visit: Payer: BC Managed Care – PPO

## 2021-07-12 ENCOUNTER — Encounter: Payer: Self-pay | Admitting: Obstetrics and Gynecology

## 2021-08-12 ENCOUNTER — Other Ambulatory Visit: Payer: Self-pay | Admitting: Family Medicine

## 2021-08-12 DIAGNOSIS — I493 Ventricular premature depolarization: Secondary | ICD-10-CM

## 2021-08-12 DIAGNOSIS — R9431 Abnormal electrocardiogram [ECG] [EKG]: Secondary | ICD-10-CM

## 2021-08-12 DIAGNOSIS — R002 Palpitations: Secondary | ICD-10-CM

## 2021-08-12 NOTE — Progress Notes (Signed)
Pt complaining of 1 wk of palpitaitons. ECG showin multiple PVC. ASCVD 3.5. Under increased stress from work and life. Emotional difficulty from dog of 19 years passing recently. Pt feels like she has "a broken heart."

## 2021-08-30 ENCOUNTER — Ambulatory Visit (HOSPITAL_COMMUNITY)
Admission: RE | Admit: 2021-08-30 | Discharge: 2021-08-30 | Disposition: A | Discharge status: Home / Self Care | Payer: 59 | Source: Ambulatory Visit | Attending: Family Medicine | Admitting: Family Medicine

## 2021-08-30 ENCOUNTER — Other Ambulatory Visit: Payer: Self-pay

## 2021-08-30 DIAGNOSIS — R002 Palpitations: Secondary | ICD-10-CM | POA: Diagnosis not present

## 2021-08-30 DIAGNOSIS — R9431 Abnormal electrocardiogram [ECG] [EKG]: Secondary | ICD-10-CM | POA: Diagnosis not present

## 2021-08-30 DIAGNOSIS — I493 Ventricular premature depolarization: Secondary | ICD-10-CM | POA: Diagnosis not present

## 2021-08-30 LAB — ECHOCARDIOGRAM COMPLETE
Area-P 1/2: 4.8 cm2
S' Lateral: 2.3 cm

## 2021-09-06 ENCOUNTER — Other Ambulatory Visit: Payer: 59

## 2021-09-15 ENCOUNTER — Ambulatory Visit
Admission: RE | Admit: 2021-09-15 | Discharge: 2021-09-15 | Disposition: A | Discharge status: Home / Self Care | Payer: No Typology Code available for payment source | Source: Ambulatory Visit | Attending: Family Medicine | Admitting: Family Medicine

## 2021-09-15 DIAGNOSIS — R9431 Abnormal electrocardiogram [ECG] [EKG]: Secondary | ICD-10-CM

## 2021-09-15 DIAGNOSIS — I493 Ventricular premature depolarization: Secondary | ICD-10-CM

## 2021-09-15 DIAGNOSIS — R002 Palpitations: Secondary | ICD-10-CM

## 2021-09-27 ENCOUNTER — Telehealth: Payer: Self-pay

## 2021-09-27 NOTE — Telephone Encounter (Signed)
NOTES SCANNED TO REFERRAL 

## 2021-10-03 NOTE — Progress Notes (Signed)
? ? ?Philbert Riser, MD ?Reason for referral-palpitations coronary artery disease/elevated calcium score ? ?HPI: 61 year old female for evaluation of palpitations coronary artery disease and elevated calcium score at request of Shelly Flatten, MD. Echocardiogram February 2023 showed normal LV function. Calcium score March 2023 346 which was 96 percentile and dilated ascending aorta at 40 mm.  Patient does have occasional "indigestion".  She does not have exertional chest pain.  Dyspnea with more vigorous activities but not routine activities.  No orthopnea, PND, pedal edema or syncope.  Cardiology now asked to evaluate. ? ?Current Outpatient Medications  ?Medication Sig Dispense Refill  ? Aspirin-Acetaminophen-Caffeine (EXCEDRIN MIGRAINE PO) Take by mouth as needed.    ? atorvastatin (LIPITOR) 10 MG tablet Take 10 mg by mouth daily.    ? Calcium Carb-Cholecalciferol (CALCIUM 1000 + D PO) Take 650 mg by mouth daily.    ? Estradiol 10 MCG TABS vaginal tablet Place 1 tablet (10 mcg total) vaginally 2 (two) times a week. 24 tablet 3  ? FLUoxetine (PROZAC) 10 MG capsule Take 10 mg by mouth daily.    ? hydroquinone 4 % cream daily.    ? Naproxen Sodium (ALEVE PO) Take by mouth as needed.    ? nitrofurantoin, macrocrystal-monohydrate, (MACROBID) 100 MG capsule Take one tablet orally after intercourse. 30 capsule 1  ? valACYclovir (VALTREX) 500 MG tablet TAKE 1 TABLET BY MOUTH TWICE DAILY FOR 3 DAYS WITH SYMPTOMS 30 tablet 2  ? Zoledronic Acid (RECLAST IV) Inject into the vein as directed.    ? ?No current facility-administered medications for this visit.  ? ? ?No Known Allergies ? ? ?Past Medical History:  ?Diagnosis Date  ? ASCUS on Pap smear 07/11/1995  ? Hemorrhoid   ? HSV-2 (herpes simplex virus 2) infection 10/09/2003  ? Hyperlipidemia   ? Osteoporosis 03/10/2012  ? Vitamin D deficiency   ? ? ?Past Surgical History:  ?Procedure Laterality Date  ? BREAST BIOPSY  1986  ? ESOPHAGOGASTRODUODENOSCOPY    ? Dr. Loreta Ave   ? ? ?Social History  ? ?Socioeconomic History  ? Marital status: Married  ?  Spouse name: Not on file  ? Number of children: 1  ? Years of education: Not on file  ? Highest education level: Not on file  ?Occupational History  ? Not on file  ?Tobacco Use  ? Smoking status: Never  ? Smokeless tobacco: Never  ?Vaping Use  ? Vaping Use: Never used  ?Substance and Sexual Activity  ? Alcohol use: Yes  ?  Alcohol/week: 1.0 standard drink  ?  Types: 1 Standard drinks or equivalent per week  ?  Comment: Occasional  ? Drug use: No  ? Sexual activity: Yes  ?  Partners: Male  ?  Birth control/protection: Post-menopausal  ?Other Topics Concern  ? Not on file  ?Social History Narrative  ? Not on file  ? ?Social Determinants of Health  ? ?Financial Resource Strain: Not on file  ?Food Insecurity: Not on file  ?Transportation Needs: Not on file  ?Physical Activity: Not on file  ?Stress: Not on file  ?Social Connections: Not on file  ?Intimate Partner Violence: Not on file  ? ? ?Family History  ?Problem Relation Age of Onset  ? Hyperlipidemia Mother   ? ? ?ROS: no fevers or chills, productive cough, hemoptysis, dysphasia, odynophagia, melena, hematochezia, dysuria, hematuria, rash, seizure activity, orthopnea, PND, pedal edema, claudication. Remaining systems are negative. ? ?Physical Exam:  ? ?Blood pressure 132/80, pulse 69, height 5\' 4"  (1.626  m), weight 139 lb 9.6 oz (63.3 kg), last menstrual period 01/26/2013, SpO2 99 %. ? ?General:  Well developed/well nourished in NAD ?Skin warm/dry ?Patient not depressed ?No peripheral clubbing ?Back-normal ?HEENT-normal/normal eyelids ?Neck supple/normal carotid upstroke bilaterally; no bruits; no JVD; no thyromegaly ?chest - CTA/ normal expansion ?CV - RRR/normal S1 and S2; no murmurs, rubs or gallops;  PMI nondisplaced ?Abdomen -NT/ND, no HSM, no mass, + bowel sounds, no bruit ?2+ femoral pulses, no bruits ?Ext-no edema, chords, 2+ DP ?Neuro-grossly nonfocal ? ?ECG -normal sinus rhythm at  a rate of 69, no ST changes.  Personally reviewed ? ?A/P ? ?1 elevated calcium score/coronary artery disease-plan to add aspirin 81 mg daily.  Continue statin. ? ?2 hyperlipidemia-given documented coronary disease I would like to increase to 80 mg daily.  However she would prefer 40 mg to start.  Check lipids and liver in 8 weeks.  Goal LDL less than 50. ? ?3 palpitations-patient feels as though this was related to stress.  We will follow and we can consider monitor in the future if needed. ? ?4 dilated aortic root-we will plan CTA of thoracic aorta in 1 year to reassess. ? ?Olga Millers, MD ? ?

## 2021-10-04 ENCOUNTER — Encounter: Payer: Self-pay | Admitting: Cardiology

## 2021-10-04 ENCOUNTER — Other Ambulatory Visit: Payer: Self-pay

## 2021-10-04 ENCOUNTER — Ambulatory Visit: Payer: 59 | Admitting: Cardiology

## 2021-10-04 VITALS — BP 132/80 | HR 69 | Ht 64.0 in | Wt 139.6 lb

## 2021-10-04 DIAGNOSIS — I711 Thoracic aortic aneurysm, ruptured, unspecified: Secondary | ICD-10-CM | POA: Diagnosis not present

## 2021-10-04 DIAGNOSIS — E7800 Pure hypercholesterolemia, unspecified: Secondary | ICD-10-CM

## 2021-10-04 DIAGNOSIS — R002 Palpitations: Secondary | ICD-10-CM

## 2021-10-04 DIAGNOSIS — I2510 Atherosclerotic heart disease of native coronary artery without angina pectoris: Secondary | ICD-10-CM | POA: Diagnosis not present

## 2021-10-04 MED ORDER — ATORVASTATIN CALCIUM 40 MG PO TABS: 40.0000 mg | ORAL_TABLET | Freq: Every day | ORAL | 3 refills | Status: DC

## 2021-10-04 MED ORDER — ASPIRIN EC 81 MG PO TBEC: 81.0000 mg | DELAYED_RELEASE_TABLET | Freq: Every day | ORAL | 3 refills | Status: AC

## 2021-10-04 NOTE — Patient Instructions (Signed)
Medication Instructions:  ? ?START ASPIRIN 81 MG ONCE DAILY ? ?INCREASE ATORVASTATIN TO 40 MG ONCE DAILY=4 OF THE 10 MG TABLETS ONCE DAILY ? ?*If you need a refill on your cardiac medications before your next appointment, please call your pharmacy* ? ? ?Lab Work: ? ?Your physician recommends that you return for lab work in: 8 [facility] ? ?If you have labs (blood work) drawn today and your tests are completely normal, you will receive your results only by: ?MyChart Message (if you have MyChart) OR ?A paper copy in the mail ?If you have any lab test that is abnormal or we need to change your treatment, we will call you to review the results. ? ? ?Testing/Procedures: ? ?Your physician has requested that you have en exercise stress myoview. For further information please visit [email or web address] Please follow instruction sheet, as given. [address] ? ? ?Follow-Up: ?At [facility], you and your health needs are our priority.  As part of our continuing mission to provide you with exceptional heart care, we have created designated Provider Care Teams.  These Care Teams include your primary Cardiologist (physician) and Advanced Practice Providers (APPs -  Physician Assistants and Nurse Practitioners) who all work together to provide you with the care you need, when you need it. ? ?We recommend signing up for the patient portal called "MyChart".  Sign up information is provided on this After Visit Summary.  MyChart is used to connect with patients for Virtual Visits (Telemedicine).  Patients are able to view lab/test results, encounter notes, upcoming appointments, etc.  Non-urgent messages can be sent to your provider as well.   ?To learn more about what you can do with MyChart, go to [email or web address].   ? ?Your next appointment:   ?12 month(s) ? ?The format for your next appointment:   ?In Person ? ?Provider:   ?Olga Millers MD  ? ? ?

## 2021-10-05 ENCOUNTER — Other Ambulatory Visit: Payer: Self-pay | Admitting: *Deleted

## 2021-10-05 DIAGNOSIS — I2510 Atherosclerotic heart disease of native coronary artery without angina pectoris: Secondary | ICD-10-CM

## 2021-10-12 ENCOUNTER — Telehealth (HOSPITAL_COMMUNITY): Payer: Self-pay | Admitting: *Deleted

## 2021-10-12 NOTE — Telephone Encounter (Signed)
Patient given detailed instructions per Myocardial Perfusion Study Information Sheet for the test on 10/18/21 Patient notified to arrive 15 minutes early and that it is imperative to arrive on time for appointment to keep from having the test rescheduled. ? If you need to cancel or reschedule your appointment, please call the office within 24 hours of your appointment. . Patient verbalized understanding.  Jacqueline Silva Jacqueline ? ? ? ?

## 2021-10-18 ENCOUNTER — Ambulatory Visit (HOSPITAL_COMMUNITY): Payer: 59 | Attending: Cardiovascular Disease

## 2021-10-18 DIAGNOSIS — I2510 Atherosclerotic heart disease of native coronary artery without angina pectoris: Secondary | ICD-10-CM | POA: Insufficient documentation

## 2021-10-18 LAB — MYOCARDIAL PERFUSION IMAGING
Angina Index: 0
Duke Treadmill Score: 9
Estimated workload: 10.1
Exercise duration (min): 9 min
LV dias vol: 57 mL (ref 46–106)
LV sys vol: 14 mL
MPHR: 158 {beats}/min
Nuc Stress EF: 76 %
Peak HR: 157 {beats}/min
Percent HR: 99 %
Rest HR: 68 {beats}/min
Rest Nuclear Isotope Dose: 10.4 mCi
SDS: 9
SRS: 1
SSS: 10
ST Depression (mm): 0 mm
Stress Nuclear Isotope Dose: 31.6 mCi
TID: 0.91

## 2021-10-18 MED ORDER — TECHNETIUM TC 99M TETROFOSMIN IV KIT
31.6000 | PACK | Freq: Once | INTRAVENOUS | Status: AC | PRN
  Administered 2021-10-18: 31.6 via INTRAVENOUS
  Filled 2021-10-18: qty 32

## 2021-10-18 MED ORDER — TECHNETIUM TC 99M TETROFOSMIN IV KIT
10.4000 | PACK | Freq: Once | INTRAVENOUS | Status: AC | PRN
  Administered 2021-10-18: 10.4 via INTRAVENOUS
  Filled 2021-10-18: qty 11

## 2021-11-10 ENCOUNTER — Other Ambulatory Visit: Payer: Self-pay

## 2021-11-10 DIAGNOSIS — N952 Postmenopausal atrophic vaginitis: Secondary | ICD-10-CM

## 2021-11-10 MED ORDER — ESTRADIOL 10 MCG VA TABS: 1.0000 | ORAL_TABLET | VAGINAL | 0 refills | Status: DC

## 2021-11-10 NOTE — Telephone Encounter (Signed)
Last AEX 11/25/20--scheduled 12/07/21. ?Last mammo 07/06/21.  ?

## 2021-11-24 DIAGNOSIS — K219 Gastro-esophageal reflux disease without esophagitis: Secondary | ICD-10-CM | POA: Insufficient documentation

## 2021-11-29 NOTE — Progress Notes (Deleted)
61 y.o. G54P1001 Married White or Caucasian Not Hispanic or Latino female here for annual exam.      Patient's last menstrual period was 01/26/2013.          Sexually active: {yes no:314532}  The current method of family planning is {contraception:315051}.    Exercising: {yes no:314532}  {types:19826} Smoker:  {YES NO:22349}  Health Maintenance: Pap:  07-16-2018 neg HPV HR neg, 05-19-15 neg HPV HR neg History of abnormal Pap:  no MMG:  07/12/21 Bi-rads 1 neg  BMD:   09/13/20 T score of -2.6 in her spine, 7% decrease from her prior DEXA on 06/24/18. There was a -1% change in her right femur and a +5% change in her left femur.  Colonoscopy: 2020 f/u 10 yrs TDaP:  2017  Gardasil: n/a   reports that she has never smoked. She has never used smokeless tobacco. She reports current alcohol use of about 1.0 standard drink per week. She reports that she does not use drugs.  Past Medical History:  Diagnosis Date   ASCUS on Pap smear 07/11/1995   Hemorrhoid    HSV-2 (herpes simplex virus 2) infection 10/09/2003   Hyperlipidemia    Osteoporosis 03/10/2012   Vitamin D deficiency     Past Surgical History:  Procedure Laterality Date   BREAST BIOPSY  1986   ESOPHAGOGASTRODUODENOSCOPY     Dr. Loreta Ave    Current Outpatient Medications  Medication Sig Dispense Refill   aspirin EC 81 MG tablet Take 1 tablet (81 mg total) by mouth daily. Swallow whole. 90 tablet 3   Aspirin-Acetaminophen-Caffeine (EXCEDRIN MIGRAINE PO) Take by mouth as needed.     atorvastatin (LIPITOR) 40 MG tablet Take 1 tablet (40 mg total) by mouth daily. 90 tablet 3   Calcium Carb-Cholecalciferol (CALCIUM 1000 + D PO) Take 650 mg by mouth daily.     Estradiol 10 MCG TABS vaginal tablet Place 1 tablet (10 mcg total) vaginally 2 (two) times a week. 24 tablet 0   FLUoxetine (PROZAC) 10 MG capsule Take 10 mg by mouth daily.     hydroquinone 4 % cream daily.     Naproxen Sodium (ALEVE PO) Take by mouth as needed.     nitrofurantoin,  macrocrystal-monohydrate, (MACROBID) 100 MG capsule Take one tablet orally after intercourse. 30 capsule 1   valACYclovir (VALTREX) 500 MG tablet TAKE 1 TABLET BY MOUTH TWICE DAILY FOR 3 DAYS WITH SYMPTOMS 30 tablet 2   Zoledronic Acid (RECLAST IV) Inject into the vein as directed.     No current facility-administered medications for this visit.    Family History  Problem Relation Age of Onset   Hyperlipidemia Mother     Review of Systems  Exam:   LMP 01/26/2013   Weight change: @WEIGHTCHANGE @ Height:      Ht Readings from Last 3 Encounters:  10/18/21 5\' 4"  (1.626 m)  10/04/21 5\' 4"  (1.626 m)  11/25/20 5' 3.25" (1.607 m)    General appearance: alert, cooperative and appears stated age Head: Normocephalic, without obvious abnormality, atraumatic Neck: no adenopathy, supple, symmetrical, trachea midline and thyroid {CHL AMB PHY EX THYROID NORM DEFAULT:[phone]::"normal to inspection and palpation"} Lungs: clear to auscultation bilaterally Cardiovascular: regular rate and rhythm Breasts: {Exam; breast:13139::"normal appearance, no masses or tenderness"} Abdomen: soft, non-tender; non distended,  no masses,  no organomegaly Extremities: extremities normal, atraumatic, no cyanosis or edema Skin: Skin color, texture, turgor normal. No rashes or lesions Lymph nodes: Cervical, supraclavicular, and axillary nodes normal. No abnormal inguinal nodes  palpated Neurologic: Grossly normal   Pelvic: External genitalia:  no lesions              Urethra:  normal appearing urethra with no masses, tenderness or lesions              Bartholins and Skenes: normal                 Vagina: normal appearing vagina with normal color and discharge, no lesions              Cervix: {CHL AMB PHY EX CERVIX NORM DEFAULT:[phone]::"no lesions"}               Bimanual Exam:  Uterus:  {CHL AMB PHY EX UTERUS NORM DEFAULT:[phone]::"normal size, contour, position, consistency, mobility, non-tender"}               Adnexa: {CHL AMB PHY EX ADNEXA NO MASS DEFAULT:[phone]::"no mass, fullness, tenderness"}               Rectovaginal: Confirms               Anus:  normal sphincter tone, no lesions  *** chaperoned for the exam.  A:  Well Woman with normal exam  P:

## 2021-12-06 ENCOUNTER — Encounter: Payer: Self-pay | Admitting: *Deleted

## 2021-12-07 ENCOUNTER — Ambulatory Visit: Payer: 59 | Admitting: Obstetrics and Gynecology

## 2021-12-07 NOTE — Progress Notes (Signed)
61 y.o. G7P1001 Married White or Caucasian Not Hispanic or Latino female here for annual exam.  Uses vaginal estrogen.   Reports mucousy vaginal discharge, denies any itching/odor. No change, not symptomatic. She is using vaginal estrogen. Vaginal estrogen is helping, less painful to have sex, but infrequent.   Uses macrobid for UTI prevention with IC.  H/O HSV, takes prn valtrex  09/15/21 cardiac CT score was 346, 96%.  She was started on lipitor, recent blood work showed improved lipids but now elevated LFT's. She has seen Cardiology.  This has created a lot of stress for her. She has started prozac and is doing better.    In 2015 her T score in her spine had improved from -2.9 to - 2.5 on Fosamax. She was continued on Fosamax.    In November, 2017 she had a T score change in her spine from -2.5 to -3.1 while still taking fosamax. She was seen by Dr Sabra Heck in 12/17 had had more of a work up.  Normal PTH and calcium Normal 24 hour urine calcium In 4/18 she was switched from Fosamax to Prolia.   DEXA from 06/24/18 T score in her spine improved from -3.1 to -2.1 while on the prolia. At the same time she had a -5% change in her hip.   The patient was transitioned from [place] back to Fosamax in  January, 2020   DEXA from 09/13/20 T score of -2.6 in her spine, 7% decrease from her prior DEXA on 06/24/18. There was a -1% change in her right femur and a +5% change in her left femur.   In 6/22 she had a Reclast infusion. She is due for a repeat DEXA at [facility], need to make sure she has had improvement on the Reclast.   Patient's last menstrual period was 01/26/2013.          Sexually active: Yes.    The current method of family planning is post menopausal status.    Exercising: Yes.     Walking 5x a week or more and some weights Smoker:  no  Health Maintenance: Pap: 07/16/2018-WNL, HPV- neg, 05/19/15- WNL, HPV- neg History of abnormal Pap:  no MMG: 07/06/21-neg birads 1 BMD:  09/13/20-osteoporosis Colonoscopy: 03/19/19-due 2030 TDaP: 2017 Gardasil: none    reports that she has never smoked. She has never used smokeless tobacco. She reports current alcohol use of about 1.0 standard drink per week. She reports that she does not use drugs. 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 Daughter is single and living in [place], [place].   Past Medical History:  Diagnosis Date   ASCUS on Pap smear 07/11/1995   Hemorrhoid    HSV-2 (herpes simplex virus 2) infection 10/09/2003   Hyperlipidemia    Osteoporosis 03/10/2012   Vitamin D deficiency     Past Surgical History:  Procedure Laterality Date   BREAST BIOPSY  1986   ESOPHAGOGASTRODUODENOSCOPY     Dr. Collene Mares    Current Outpatient Medications  Medication Sig Dispense Refill   amoxicillin-clavulanate (AUGMENTIN) 875-125 MG tablet Take 1 tablet by mouth 2 (two) times daily.     aspirin EC 81 MG tablet Take 1 tablet (81 mg total) by mouth daily. Swallow whole. 90 tablet 3   atorvastatin (LIPITOR) 40 MG tablet Take 1 tablet (40 mg total) by mouth daily. 90 tablet 3   Calcium Carb-Cholecalciferol (CALCIUM 1000 + D PO) Take 650 mg by mouth daily.     Estradiol  10 MCG TABS vaginal tablet Place 1 tablet (10 mcg total) vaginally 2 (two) times a week. 24 tablet 0   fluconazole (DIFLUCAN) 150 MG tablet Take 150 mg by mouth daily.     FLUoxetine (PROZAC) 10 MG capsule Take 10 mg by mouth daily.     hydroquinone 4 % cream daily.     nitrofurantoin, macrocrystal-monohydrate, (MACROBID) 100 MG capsule Take one tablet orally after intercourse. 30 capsule 1   tretinoin (RETIN-A) 0.05 % cream Apply 1 application. topically at bedtime.     valACYclovir (VALTREX) 500 MG tablet TAKE 1 TABLET BY MOUTH TWICE DAILY FOR 3 DAYS WITH SYMPTOMS 30 tablet 2   Zoledronic Acid (RECLAST IV) Inject into the vein as directed.     No current facility-administered medications for this visit.    Family History  Problem Relation Age  of Onset   Hyperlipidemia Mother     Review of Systems  Genitourinary:  Positive for vaginal discharge.  All other systems reviewed and are negative.  Exam:   BP 124/88   Pulse 84   Ht 5' 3.25" (1.607 m)   Wt 128 lb (58.1 kg)   LMP 01/26/2013   SpO2 96%   BMI 22.50 kg/m   Weight change: @WEIGHTCHANGE @ Height:   Height: 5' 3.25" (160.7 cm)  Ht Readings from Last 3 Encounters:  12/13/21 5' 3.25" (1.607 m)  10/18/21 5\' 4"  (1.626 m)  10/04/21 5\' 4"  (1.626 m)    General appearance: alert, cooperative and appears stated age Head: Normocephalic, without obvious abnormality, atraumatic Neck: no adenopathy, supple, symmetrical, trachea midline and thyroid normal to inspection and palpation Lungs: clear to auscultation bilaterally Cardiovascular: regular rate and rhythm Breasts: normal appearance, no masses or tenderness Abdomen: soft, non-tender; non distended,  no masses,  no organomegaly Extremities: extremities normal, atraumatic, no cyanosis or edema Skin: Skin color, texture, turgor normal. No rashes or lesions Lymph nodes: Cervical, supraclavicular, and axillary nodes normal. No abnormal inguinal nodes palpated Neurologic: Grossly normal   Pelvic: External genitalia:  no lesions              Urethra:  normal appearing urethra with no masses, tenderness or lesions              Bartholins and Skenes: normal                 Vagina: normal appearing vagina with normal color and discharge, no lesions              Cervix: no lesions               Bimanual Exam:  Uterus:  normal size, contour, position, consistency, mobility, non-tender              Adnexa: no mass, fullness, tenderness               Rectovaginal: Confirms               Anus:  normal sphincter tone, no lesions  Lovena Le, CMA chaperoned for the exam.  1. Well woman exam Discussed breast self exam Discussed calcium and vit D intake Mammogram, pap and colonoscopy UTD  2. History of osteoporosis Will order  DEXA at [facility] Due for 2nd Reclast infusion this month  3. Vaginal atrophy - Estradiol 10 MCG TABS vaginal tablet; Place 1 tablet (10 mcg total) vaginally 2 (two) times a week.  Dispense: 24 tablet; Refill: 3  4. Herpes simplex infection of genitourinary system - valACYclovir (VALTREX)  500 MG tablet; TAKE 1 TABLET BY MOUTH TWICE DAILY FOR 3 DAYS WITH SYMPTOMS  Dispense: 30 tablet; Refill: 2  5. Postcoital UT - nitrofurantoin, macrocrystal-monohydrate, (MACROBID) 100 MG capsule; Take one tablet orally after intercourse.  Dispense: 30 capsule; Refill: 2

## 2021-12-09 ENCOUNTER — Other Ambulatory Visit: Payer: Self-pay | Admitting: Obstetrics and Gynecology

## 2021-12-09 ENCOUNTER — Telehealth: Payer: Self-pay | Admitting: *Deleted

## 2021-12-09 DIAGNOSIS — M818 Other osteoporosis without current pathological fracture: Secondary | ICD-10-CM

## 2021-12-09 NOTE — Telephone Encounter (Addendum)
Pt states she will have DEXA on 12/19/2021 after results pt and MD will decided on how to proceed regarding Reclast    Reclast instructions   Annual Exam (1 year) ? 12/13/2021  Called pt to verify insurance coverage / inform pt Reclast is in Process ?  Labs must be in 30 days window Serum Creatinine DATE? SAME DAY AS ANNUAL EXAM Serum Calcium DATE? SAME DAY AS ANNUAL EXAM   PA needed?  Cost for Pt?  Order form filled out and faxed  w/MD sig?  Infusion will be done at ?  Pt aware?  Instructions mailed to pt?

## 2021-12-09 NOTE — Progress Notes (Signed)
Reclast Labs must be in 30 days window Serum Creatinine DATE? NEEDS LABS SAME DAY OF ANNUAL EXAM Serum Calcium DATE?NEEDS LABS SAME DAY OF ANNUAL EXAM

## 2021-12-13 ENCOUNTER — Encounter: Payer: Self-pay | Admitting: Obstetrics and Gynecology

## 2021-12-13 ENCOUNTER — Ambulatory Visit (INDEPENDENT_AMBULATORY_CARE_PROVIDER_SITE_OTHER): Payer: 59 | Admitting: Obstetrics and Gynecology

## 2021-12-13 VITALS — BP 124/88 | HR 84 | Ht 63.25 in | Wt 128.0 lb

## 2021-12-13 DIAGNOSIS — A6000 Herpesviral infection of urogenital system, unspecified: Secondary | ICD-10-CM

## 2021-12-13 DIAGNOSIS — Z01419 Encounter for gynecological examination (general) (routine) without abnormal findings: Secondary | ICD-10-CM

## 2021-12-13 DIAGNOSIS — Z8739 Personal history of other diseases of the musculoskeletal system and connective tissue: Secondary | ICD-10-CM

## 2021-12-13 DIAGNOSIS — N952 Postmenopausal atrophic vaginitis: Secondary | ICD-10-CM

## 2021-12-13 DIAGNOSIS — N390 Urinary tract infection, site not specified: Secondary | ICD-10-CM

## 2021-12-13 DIAGNOSIS — Z1211 Encounter for screening for malignant neoplasm of colon: Secondary | ICD-10-CM | POA: Insufficient documentation

## 2021-12-13 MED ORDER — NITROFURANTOIN MONOHYD MACRO 100 MG PO CAPS: ORAL_CAPSULE | ORAL | 2 refills | Status: AC

## 2021-12-13 MED ORDER — VALACYCLOVIR HCL 500 MG PO TABS: ORAL_TABLET | ORAL | 2 refills | Status: DC

## 2021-12-13 MED ORDER — ESTRADIOL 10 MCG VA TABS: 1.0000 | ORAL_TABLET | VAGINAL | 3 refills | Status: DC

## 2021-12-13 NOTE — Patient Instructions (Signed)

## 2021-12-14 ENCOUNTER — Other Ambulatory Visit: Payer: Self-pay

## 2021-12-14 ENCOUNTER — Encounter: Payer: Self-pay | Admitting: Cardiology

## 2021-12-14 DIAGNOSIS — I2510 Atherosclerotic heart disease of native coronary artery without angina pectoris: Secondary | ICD-10-CM

## 2021-12-14 DIAGNOSIS — E7800 Pure hypercholesterolemia, unspecified: Secondary | ICD-10-CM

## 2021-12-14 DIAGNOSIS — Z8739 Personal history of other diseases of the musculoskeletal system and connective tissue: Secondary | ICD-10-CM

## 2021-12-14 MED ORDER — EZETIMIBE 10 MG PO TABS: 10.0000 mg | ORAL_TABLET | Freq: Every day | ORAL | 3 refills | Status: DC

## 2021-12-14 MED ORDER — ATORVASTATIN CALCIUM 10 MG PO TABS: 10.0000 mg | ORAL_TABLET | Freq: Every day | ORAL | 3 refills | Status: DC

## 2021-12-22 ENCOUNTER — Encounter: Payer: Self-pay | Admitting: Obstetrics and Gynecology

## 2021-12-30 ENCOUNTER — Other Ambulatory Visit: Payer: Self-pay | Admitting: *Deleted

## 2021-12-30 DIAGNOSIS — M8580 Other specified disorders of bone density and structure, unspecified site: Secondary | ICD-10-CM

## 2021-12-30 DIAGNOSIS — M818 Other osteoporosis without current pathological fracture: Secondary | ICD-10-CM

## 2022-01-19 NOTE — Telephone Encounter (Signed)
Follow up on Reclast patient will see endocrinologist 03/10/2022 to discuss osteoporosis treatments

## 2022-01-19 NOTE — Telephone Encounter (Signed)
Left message for pt to return my call.

## 2022-02-07 ENCOUNTER — Encounter: Payer: Self-pay | Admitting: Cardiology

## 2022-05-04 ENCOUNTER — Encounter: Payer: Self-pay | Admitting: Cardiology

## 2022-05-04 NOTE — Telephone Encounter (Signed)
[place] Triage (supporting Lelon Perla, MD) 28 minutes ago (10:20 AM)    Dr. Stanford Breed- On 10/13/523, Dr. Daryll Drown recommended another course of Prilosec (40 mg for 14 days, then 20 mg daily for 3 months) for discomfort in chest (middle chest below breasts). Improvement noticed last week, however, Mon. 04/30/22 I've felt a different discomfort in upper chest. It has become more persistent over past 24 hours. At times, feels like when I go for a walk on a cold day and I breathe in cold air. Doesn't worsen upon walking or climbing stairs or breathing, and I'm not having other symptoms (except anxiety of not knowing what's going on). First thought is my heart, therefore reaching out to you for guidance. Thanks for your care! Jacqueline Silva [phone]   Called patient in regards to [place] message. She reports discomfort in upper chest, started as a "tingle". It had been off and on but was persistent last night. She has had these symptoms for about 1 month. PCP thought may be GERD. She was prescribed Prilosec and it helped last week but has not noticed a change this week, possibly worsening. Advised she should be seen for eval -- scheduled for 10/27 with Curt Bears NP

## 2022-05-05 ENCOUNTER — Encounter: Payer: Self-pay | Admitting: Adult Health

## 2022-05-05 ENCOUNTER — Ambulatory Visit: Payer: 59 | Attending: Adult Health | Admitting: Adult Health

## 2022-05-05 VITALS — BP 115/77 | HR 79 | Ht 64.0 in | Wt 138.0 lb

## 2022-05-05 DIAGNOSIS — I10 Essential (primary) hypertension: Secondary | ICD-10-CM | POA: Diagnosis not present

## 2022-05-05 DIAGNOSIS — I2510 Atherosclerotic heart disease of native coronary artery without angina pectoris: Secondary | ICD-10-CM

## 2022-05-05 DIAGNOSIS — E7800 Pure hypercholesterolemia, unspecified: Secondary | ICD-10-CM

## 2022-05-05 NOTE — Progress Notes (Signed)
Cardiology Clinic Note   Patient Name: Jacqueline Silva Date of Encounter: 05/05/2022  Primary Care Provider:  Waldemar Dickens, Silva Primary Cardiologist:  None  Patient Profile    61 year old female for evaluation of palpitations coronary artery disease and elevated calcium score at request of Jacqueline Silva. Echocardiogram February 2023 showed normal LV function. Calcium score March 2023 346 which was 96 percentile and dilated ascending aorta at 40 mm.  Past Medical History    Past Medical History:  Diagnosis Date   ASCUS on Pap smear 07/11/1995   Hemorrhoid    HSV-2 (herpes simplex virus 2) infection 10/09/2003   Hyperlipidemia    Osteoporosis 03/10/2012   Vitamin D deficiency    Past Surgical History:  Procedure Laterality Date   BREAST BIOPSY  1986   ESOPHAGOGASTRODUODENOSCOPY     Jacqueline Silva    Allergies  No Known Allergies  History of Present Illness    Jacqueline Silva comes today with some anxiety associated with recurrent discomfort in her chest.  She describes it as an "ache" substernally, she also has what she calls "sensations" moving from the left to the right side of her chest.  Is not worsened with activity, excessive exertion, or relieved by rest.  This goes away on its own and is transient.  She is very concerned about her coronary calcium score as well.  She also is concerned about elevated AST and ALT labs, after review by Jacqueline Silva Lipitor was decreased to 10 mg daily.  She has had some anxiety about the death of her pets which occurred this year which continues to upset her.  She has seen her primary care provider who is reassured her concerning her cardiac testing and believes it would be best if she seek psychiatric counseling to assist her with her anxiety.  She has been medically compliant, does not have any acute complaints.  Home Medications    Current Outpatient Medications  Medication Sig Dispense Refill   aspirin EC 81 MG tablet Take 1 tablet  (81 mg total) by mouth daily. Swallow whole. 90 tablet 3   atorvastatin (LIPITOR) 10 MG tablet Take 1 tablet (10 mg total) by mouth daily. 90 tablet 3   Calcium Carb-Cholecalciferol (CALCIUM 1000 + D PO) Take 650 mg by mouth daily.     Estradiol 10 MCG TABS vaginal tablet Place 1 tablet (10 mcg total) vaginally 2 (two) times a week. 24 tablet 3   ezetimibe (ZETIA) 10 MG tablet Take 1 tablet (10 mg total) by mouth daily. 90 tablet 3   fluconazole (DIFLUCAN) 150 MG tablet Take 150 mg by mouth daily.     FLUoxetine (PROZAC) 10 MG capsule Take 10 mg by mouth daily.     hydroquinone 4 % cream daily.     nitrofurantoin, macrocrystal-monohydrate, (MACROBID) 100 MG capsule Take one tablet orally after intercourse. 30 capsule 2   tretinoin (RETIN-A) 0.05 % cream Apply 1 application. topically at bedtime.     valACYclovir (VALTREX) 500 MG tablet TAKE 1 TABLET BY MOUTH TWICE DAILY FOR 3 DAYS WITH SYMPTOMS 30 tablet 2   Zoledronic Acid (RECLAST IV) Inject into the vein as directed.     No current facility-administered medications for this visit.     Family History    Family History  Problem Relation Age of Onset   Hyperlipidemia Mother    She indicated that her mother is alive. She indicated that her father is alive.  Social History    Social  History   Socioeconomic History   Marital status: Married    Spouse name: Not on file   Number of children: 1   Years of education: Not on file   Highest education level: Not on file  Occupational History   Not on file  Tobacco Use   Smoking status: Never   Smokeless tobacco: Never  Vaping Use   Vaping Use: Never used  Substance and Sexual Activity   Alcohol use: Yes    Alcohol/week: 1.0 standard drink of alcohol    Types: 1 Standard drinks or equivalent per week    Comment: Occasional   Drug use: No   Sexual activity: Yes    Partners: Male    Birth control/protection: Post-menopausal  Other Topics Concern   Not on file  Social History  Narrative   Not on file   Social Determinants of Health   Financial Resource Strain: Not on file  Food Insecurity: Not on file  Transportation Needs: Not on file  Physical Activity: Not on file  Stress: Not on file  Social Connections: Not on file  Intimate Partner Violence: Not on file     Review of Systems    General:  No chills, fever, night sweats or weight changes.  Cardiovascular:  No chest pain, dyspnea on exertion, edema, orthopnea, palpitations, paroxysmal nocturnal dyspnea. Dermatological: No rash, lesions/masses Respiratory: No cough, dyspnea Urologic: No hematuria, dysuria Abdominal:   No nausea, vomiting, diarrhea, bright red blood per rectum, melena, or hematemesis Neurologic:  No visual changes, wkns, changes in mental status. All other systems reviewed and are otherwise negative except as noted above.     Physical Exam    VS:  BP 115/77   Pulse 79   Ht 5\' 4"  (1.626 m)   Wt 138 lb (62.6 kg)   LMP 07/10/2002   SpO2 97%   BMI 23.69 kg/m  , BMI Body mass index is 23.69 kg/m.     GEN: Well nourished, well developed, in no acute distress. HEENT: normal. Neck: Supple, no JVD, carotid bruits, or masses. Cardiac: RRR, no murmurs, rubs, or gallops. No clubbing, cyanosis, edema.  Radials/DP/PT 2+ and equal bilaterally.  Respiratory:  Respirations regular and unlabored, clear to auscultation bilaterally. GI: Soft, nontender, nondistended, BS + x 4. MS: no deformity or atrophy. Skin: warm and dry, no rash. Neuro:  Strength and sensation are intact. Psych: Normal affect.  Accessory Clinical Findings    ECG personally reviewed by me today-normal sinus rhythm with biatrial enlargement heart rate of 79 bpm,- No acute changes  No results found for: "WBC", "HGB", "HCT", "MCV", "PLT" Lab Results  Component Value Date   CREATININE 0.81 06/20/2016   BUN 12 06/20/2016   NA 141 06/20/2016   K 4.5 06/20/2016   CL 108 06/20/2016   CO2 25 06/20/2016   Lab Results   Component Value Date   ALT 15 06/20/2016   AST 21 06/20/2016   ALKPHOS 70 06/20/2016   BILITOT 0.2 06/20/2016   No results found for: "CHOL", "HDL", "LDLCALC", "LDLDIRECT", "TRIG", "CHOLHDL"  No results found for: "HGBA1C"  Review of Prior Studies: The study is normal. The study is low risk.   No ST deviation was noted.   LV perfusion is abnormal. Defect 1: There is a small defect with mild reduction in uptake present in the mid anterior location(s) that is fixed. There is normal wall motion in the defect area. Consistent with artifact caused by breast attenuation. The artifact is seen during  supine imaging, not during upright imaging.   Left ventricular function is normal. Nuclear stress EF: 76 %. The left ventricular ejection fraction is hyperdynamic (>65%). End diastolic cavity size is normal.   Prior study not available for comparison.  Assessment & Plan   1.  Recurrent chest discomfort: I have reviewed all of her cardiac work-up information.  I have given her reassurance on each test as it does not reveal concerning results that would cause [place] to be suspicious of impending MI, or significant heart disease.  She does seem somewhat relieved about this discussion.  Would not anticipate doing any further cardiac testing unless her symptoms become significantly worsened or there are changes in her EKG.  2.  Hypercholesterolemia: Lipitor has been decreased from 40 mg daily to 10 mg daily due to elevated ALT and AST.  Repeating these labs.  She has a follow-up appointment with Jacqueline Silva in December which I have encouraged her to keep.  She continues to exercise although she is not as active as she would like to be.  3.  Anxiety about health: I am referring her to [facility] psychologist, to help her with chronic disease management and anxiety issues.  She verbalizes understanding and is willing to be seen.  She states that she is afraid to die, and has significant concerns about issues  surrounding the process of death.  At this time there is no evidence to substantiate that her health status is worsening, creating concern for premature death.    Current medicines are reviewed at length with the patient today.  I have spent 45 min's  dedicated to the care of this patient on the date of this encounter to include pre-visit review of records, assessment, management and diagnostic testing,with shared decision making. Signed, Phill Myron. West Pugh, ANP, AACC   05/05/2022 9:11 AM      Office [phone] Fax [phone]  Notice: This dictation was prepared with Dragon dictation along with smaller phrase technology. Any transcriptional errors that result from this process are unintentional and may not be corrected upon review.

## 2022-05-05 NOTE — Patient Instructions (Signed)
Medication Instructions:  No changes *If you need a refill on your cardiac medications before your next appointment, please call your pharmacy*   Lab Work: CMET, Lipid Panel. To Be Done in 1-2 Weeks If you have labs (blood work) drawn today and your tests are completely normal, you will receive your results only by: [place] (if you have MyChart) OR A paper copy in the mail If you have any lab test that is abnormal or we need to change your treatment, we will call you to review the results.   Testing/Procedures: No Testing   Follow-Up: At [facility], you and your health needs are our priority.  As part of our continuing mission to provide you with exceptional heart care, we have created designated Provider Care Teams.  These Care Teams include your primary Cardiologist (physician) and Advanced Practice Providers (APPs -  Physician Assistants and Nurse Practitioners) who all work together to provide you with the care you need, when you need it.  We recommend signing up for the patient portal called "MyChart".  Sign up information is provided on this After Visit Summary.  MyChart is used to connect with patients for Virtual Visits (Telemedicine).  Patients are able to view lab/test results, encounter notes, upcoming appointments, etc.  Non-urgent messages can be sent to your provider as well.   To learn more about what you can do with MyChart, go to [email or web address].    Your next appointment:   6 week(s)  The format for your next appointment:   In Person  Provider:   Jory Sims, DNP, ANP    Then, Kirk Ruths, MD will plan to see you again in 5 month(s).    Other Instructions Recommend Reduce Caffeine Intake.

## 2022-05-24 ENCOUNTER — Ambulatory Visit (INDEPENDENT_AMBULATORY_CARE_PROVIDER_SITE_OTHER): Payer: 59 | Admitting: Psychologist

## 2022-05-24 DIAGNOSIS — F4521 Hypochondriasis: Secondary | ICD-10-CM

## 2022-05-24 NOTE — Plan of Care (Signed)

## 2022-05-24 NOTE — Progress Notes (Signed)
                Ronith Berti, PsyD 

## 2022-05-24 NOTE — Progress Notes (Signed)
[facility] Counselor Initial Adult Exam  Name: Jacqueline Silva Date: 05/24/2022 MRN: [id number] DOB: 1961-05-11 PCP: Ozella Rocks, MD  Time spent: 11:00 am to 11:40 am; total time: 40 minutes  This session was held via in person. The patient consented to in-person therapy and was in the clinician's office. Limits of confidentiality were discussed with the patient.   Guardian/Payee:  NA    Paperwork requested: No   Reason for Visit /Presenting Problem: Anxiety secondary to heart condition  Mental Status Exam: Appearance:   Well Groomed     Behavior:  Appropriate  Motor:  Normal  Speech/Language:   Clear and Coherent  Affect:  Appropriate  Mood:  normal  Thought process:  normal  Thought content:    WNL  Sensory/Perceptual disturbances:    WNL  Orientation:  oriented to person, place, and time/date  Attention:  Good  Concentration:  Good  Memory:  WNL  Fund of knowledge:   Good  Insight:    Good  Judgment:   Good  Impulse Control:  Good     Reported Symptoms:  The patient endorsed experiencing the following: preoccupation with acquiring a serious illness,  high level of anxiety over health, and performs excessive health related behaviors. She denied suicidal and homicidal ideation.   Risk Assessment: Danger to Self:  No Self-injurious Behavior: No Danger to Others: No Duty to Warn:no Physical Aggression / Violence:No  Access to Firearms a concern: No  Gang Involvement:No  Patient / guardian was educated about steps to take if suicide or homicide risk level increases between visits: n/a While future psychiatric events cannot be accurately predicted, the patient does not currently require acute inpatient psychiatric care and does not currently meet [facility] involuntary commitment criteria.  Substance Abuse History: Current substance abuse: No     Past Psychiatric History:   Previous psychological history is significant for couples  counseling Outpatient Providers:NA History of Psych Hospitalization: No  Psychological Testing:  NA    Abuse History:  Victim of: No.,  NA    Report needed: No. Victim of Neglect:No. Perpetrator of  NA   Witness / Exposure to Domestic Violence: No   Protective Services Involvement: No  Witness to [employer] Violence:  No   Family History:  Family History  Problem Relation Age of Onset   Hyperlipidemia Mother     Living situation: the patient lives with their spouse  Sexual Orientation: Straight  Relationship Status: married  Name of spouse / other:Jacqueline Silva. They have been married for 20 years. This is patient's third marriage.  If a parent, number of children / ages:Jacqueline Silva who is 74 years old.   Support Systems: spouse  Financial Stress:  No   Income/Employment/Disability: Employment. Immunologist Service: No   Educational History: Education: Risk manager: Christian/Qu[employer]  Any cultural differences that may affect / interfere with treatment:  not applicable   Recreation/Hobbies: Reading books and walking  Stressors: Health problems    Strengths: Supportive Relationships  Barriers:  NA   Legal History: Pending legal issue / charges: The patient has no significant history of legal issues. History of legal issue / charges:  NA  Medical History/Surgical History: reviewed Past Medical History:  Diagnosis Date   ASCUS on Pap smear 07/11/1995   Hemorrhoid    HSV-2 (herpes simplex virus 2) infection 10/09/2003   Hyperlipidemia    Osteoporosis 03/10/2012   Vitamin D deficiency     Past Surgical History:  Procedure  Laterality Date   BREAST BIOPSY  1986   ESOPHAGOGASTRODUODENOSCOPY     Dr. Loreta Ave    Medications: Current Outpatient Medications  Medication Sig Dispense Refill   aspirin EC 81 MG tablet Take 1 tablet (81 mg total) by mouth daily. Swallow whole. 90 tablet 3   atorvastatin (LIPITOR) 10 MG tablet Take  1 tablet (10 mg total) by mouth daily. 90 tablet 3   Calcium Carb-Cholecalciferol (CALCIUM 1000 + D PO) Take 650 mg by mouth daily.     Estradiol 10 MCG TABS vaginal tablet Place 1 tablet (10 mcg total) vaginally 2 (two) times a week. 24 tablet 3   ezetimibe (ZETIA) 10 MG tablet Take 1 tablet (10 mg total) by mouth daily. 90 tablet 3   fluconazole (DIFLUCAN) 150 MG tablet Take 150 mg by mouth daily.     FLUoxetine (PROZAC) 10 MG capsule Take 10 mg by mouth daily.     hydroquinone 4 % cream daily.     nitrofurantoin, macrocrystal-monohydrate, (MACROBID) 100 MG capsule Take one tablet orally after intercourse. 30 capsule 2   tretinoin (RETIN-A) 0.05 % cream Apply 1 application. topically at bedtime.     valACYclovir (VALTREX) 500 MG tablet TAKE 1 TABLET BY MOUTH TWICE DAILY FOR 3 DAYS WITH SYMPTOMS 30 tablet 2   Zoledronic Acid (RECLAST IV) Inject into the vein as directed.     No current facility-administered medications for this visit.    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 The patient lives at home with her husband. 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 She denied suicidal and homicidal ideation.   The patient stated that she wants coping skills, to process fears related to mortality, and process emotions.   This psychologist makes the recommendation that the patient participate in therapy bi-weekly if possible.    Hilbert Corrigan, PsyD

## 2022-06-13 ENCOUNTER — Ambulatory Visit: Payer: 59 | Admitting: Psychologist

## 2022-06-14 ENCOUNTER — Ambulatory Visit (INDEPENDENT_AMBULATORY_CARE_PROVIDER_SITE_OTHER): Payer: 59 | Admitting: Psychologist

## 2022-06-14 DIAGNOSIS — F4521 Hypochondriasis: Secondary | ICD-10-CM | POA: Diagnosis not present

## 2022-06-14 NOTE — Progress Notes (Signed)
Behavioral Health Counselor/Therapist Progress Note  Patient ID: Jacqueline Silva, MRN: [id number],    Date: 06/14/2022  Time Spent: 10:03 am to 10:45 am; total time: 42 minutes   This session was held via in person. The patient consented to in-person therapy and was in the clinician's office. Limits of confidentiality were discussed with the patient.    Treatment Type: Individual Therapy  Reported Symptoms: Anxiety  Mental Status Exam: Appearance:  Well Groomed     Behavior: Appropriate  Motor: Normal  Speech/Language:  Clear and Coherent  Affect: Appropriate  Mood: normal  Thought process: normal  Thought content:   WNL  Sensory/Perceptual disturbances:   WNL  Orientation: oriented to person, place, and time/date  Attention: Good  Concentration: Good  Memory: WNL  Fund of knowledge:  Good  Insight:   Good  Judgment:  Good  Impulse Control: Good   Risk Assessment: Danger to Self:  No Self-injurious Behavior: No Danger to Others: No Duty to Warn:no Physical Aggression / Violence:No  Access to Firearms a concern: No  Gang Involvement:No   Subjective: Beginning the session, patient described herself as doing well. After reviewing the treatment plan, patient voiced that guided imagery has helped her. She voiced wanted additional skills. After practicing and processing the skills she described herself as better. She also voiced wanting to discuss how to discern anxiety from a heart condition. Patient spent time processing the difference. She was agreeable to homework and following up. She denied suicidal and homicidal ideation.    Interventions:  Worked on developing a therapeutic relationship with the patient using active listening and reflective statements. Provided emotional support using empathy and validation. Reviewed the treatment plan with the patient. Reviewed events since the intake occurred. Normalized and validated thoughts and emotions. Praised the patient  for doing better. Identified goals for the session. Provided psychoeducation about mindfulness and defusion. Practiced and processed mindfulness and defusion. Normalized patient's response. Praised patient for experiencing less distress. Practiced how to implement in different environments. Provided psychoeducation about differentiating between anxiety and heart conditions. Provided empathic statements. Assigned homework. Assessed for suicidal and homicidal ideation.   Homework: Implement mindfulness and defusion alongside guided imagery  Next Session: Review homework and emotional support.   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  Plan:  Goals Work through the grieving process and face reality of own death Accept emotional support from others around them Live life to the fullest, event though time may be limited Become as knowledgeable about the medical condition  Reduce fear, anxiety about the health condition  Accept the illness Accept the role of psychological and behavioral factors  Stabilize anxiety level wile increasing ability to function Learn and implement coping skills that result in a reduction of anxiety  Alleviate depressive symptoms Recognize, accept, and cope with depressive feelings Develop healthy thinking patterns Develop healthy interpersonal relationships  Objectives target date for all objectives is 05/25/2023 Identify feelings associated with the illness Family members share with each other feelings Identify the losses or limitations that have been experienced Verbalize acceptance of the reality of the medical condition Commit to learning and implement a proactive approach to managing personal stresses Verbalize an understanding of the medical condition Work with therapist to develop a plan for coping with stress Learn and implement skills for managing stress Engage in social, productive activities that are possible Engage in faith based  activities implement positive imagery Identify coping skills and sources of emotional support Patient's partner and family members verbalize their  fears regarding severity of health condition Identify sources of emotional distress  Learning and implement calming skills to reduce overall anxiety Learn and implement problem solving strategies Identify and engage in pleasant activities Learning and implement personal and interpersonal skills to reduce anxiety and improve interpersonal relationships Learn to accept limitations in life and commit to tolerating, rather than avoiding, unpleasant emotions while accomplishing meaningful goals Identify major life conflicts from the past and present that form the basis for present anxiety Learn and implement behavioral strategies Verbalize an understanding and resolution of current interpersonal problems Learn and implement problem solving and decision making skills Learn and implement conflict resolution skills to resolve interpersonal problems Verbalize an understanding of healthy and unhealthy emotions verbalize insight into how past relationships may be influence current experiences with depression Use mindfulness and acceptance strategies and increase value based behavior  Increase hopeful statements about the future.   Interventions Teach about stress and ways to handle stress Assist the patient in developing a coping action plan for stressors Conduct skills based training for coping strategies Train problem focused skills Sort out what activities the individual can do Encourage patient to rely upon his/her spiritual faith Teach the patient to use guided imagery Probe and evaluate family's ability to provide emotional support Allow family to share their fears Assist the patient in identifying, sorting through, and verbalizing the various feelings generated by his/her medical condition Meet with family members  Ask patient list out  limitations  Use stress inoculation training  Use Acceptance and Commitment Therapy to help client accept uncomfortable realities in order to accomplish value-consistent goals Reinforce the client's insight into the role of his/her past emotional pain and present anxiety  Discuss examples demonstrating that unrealistic worry overestimates the probability of threats and underestimate patient's ability  Assist the patient in analyzing his or her worries Help patient understand that avoidance is reinforcing  Behavioral activation help the client explore the relationship, nature of the dispute,  Help the client develop new interpersonal skills and relationships Conduct Problem so living therapy Teach conflict resolution skills Use a process-experiential approach Conduct TLDP Conduct ACT  The patient and clinician reviewed the treatment plan on 06/14/2022. The patient approved of the treatment plan.    Hilbert Corrigan, PsyD

## 2022-06-14 NOTE — Progress Notes (Signed)
                Andrej Spagnoli, PsyD 

## 2022-06-15 ENCOUNTER — Encounter: Payer: Self-pay | Admitting: *Deleted

## 2022-06-16 ENCOUNTER — Ambulatory Visit: Payer: 59 | Admitting: Adult Health

## 2022-06-22 ENCOUNTER — Ambulatory Visit: Payer: Self-pay | Admitting: Physician Assistant

## 2022-07-05 NOTE — Progress Notes (Deleted)
Cardiology Clinic Note   Patient Name: Jacqueline Silva Date of Encounter: 07/05/2022  Primary Care Provider:  Ozella Rocks, MD Primary Cardiologist:  None  Patient Profile    61 year old female for evaluation of palpitations coronary artery disease and elevated calcium score at request of Shelly Flatten, MD. Echocardiogram February 2023 showed normal LV function. Calcium score March 2023 346 which was 96 percentile and dilated ascending aorta at 40 mm. Hyperlipidemia on statin. She also suffers from anxiety with associated chest discomfort, She was referred for counseling to assist her with these issues.   Past Medical History    Past Medical History:  Diagnosis Date   ASCUS on Pap smear 07/11/1995   Hemorrhoid    HSV-2 (herpes simplex virus 2) infection 10/09/2003   Hyperlipidemia    Osteoporosis 03/10/2012   Vitamin D deficiency    Past Surgical History:  Procedure Laterality Date   BREAST BIOPSY  1986   ESOPHAGOGASTRODUODENOSCOPY     Dr. Loreta Ave    Allergies  No Known Allergies  History of Present Illness    Jacqueline Silva returns today for ongoing assessment and management of non-cardiac chest pain and hyperlipidemia. She is also followed by Dr.Schooler for anxiety and is receiving counseling.   Home Medications    Current Outpatient Medications  Medication Sig Dispense Refill   aspirin EC 81 MG tablet Take 1 tablet (81 mg total) by mouth daily. Swallow whole. 90 tablet 3   atorvastatin (LIPITOR) 10 MG tablet Take 1 tablet (10 mg total) by mouth daily. 90 tablet 3   Calcium Carb-Cholecalciferol (CALCIUM 1000 + D PO) Take 650 mg by mouth daily.     Estradiol 10 MCG TABS vaginal tablet Place 1 tablet (10 mcg total) vaginally 2 (two) times a week. 24 tablet 3   ezetimibe (ZETIA) 10 MG tablet Take 1 tablet (10 mg total) by mouth daily. 90 tablet 3   fluconazole (DIFLUCAN) 150 MG tablet Take 150 mg by mouth daily.     FLUoxetine (PROZAC) 10 MG capsule Take 10 mg by mouth  daily.     hydroquinone 4 % cream daily.     nitrofurantoin, macrocrystal-monohydrate, (MACROBID) 100 MG capsule Take one tablet orally after intercourse. 30 capsule 2   tretinoin (RETIN-A) 0.05 % cream Apply 1 application. topically at bedtime.     valACYclovir (VALTREX) 500 MG tablet TAKE 1 TABLET BY MOUTH TWICE DAILY FOR 3 DAYS WITH SYMPTOMS 30 tablet 2   Zoledronic Acid (RECLAST IV) Inject into the vein as directed.     No current facility-administered medications for this visit.     Family History    Family History  Problem Relation Age of Onset   Hyperlipidemia Mother    She indicated that her mother is alive. She indicated that her father is alive.  Social History    Social History   Socioeconomic History   Marital status: Married    Spouse name: Not on file   Number of children: 1   Years of education: Not on file   Highest education level: Not on file  Occupational History   Not on file  Tobacco Use   Smoking status: Never   Smokeless tobacco: Never  Vaping Use   Vaping Use: Never used  Substance and Sexual Activity   Alcohol use: Yes    Alcohol/week: 1.0 standard drink of alcohol    Types: 1 Standard drinks or equivalent per week    Comment: Occasional   Drug use: No  Sexual activity: Yes    Partners: Male    Birth control/protection: Post-menopausal  Other Topics Concern   Not on file  Social History Narrative   Not on file   Social Determinants of Health   Financial Resource Strain: Not on file  Food Insecurity: Not on file  Transportation Needs: Not on file  Physical Activity: Not on file  Stress: Not on file  Social Connections: Not on file  Intimate Partner Violence: Not on file     Review of Systems    General:  No chills, fever, night sweats or weight changes.  Cardiovascular:  No chest pain, dyspnea on exertion, edema, orthopnea, palpitations, paroxysmal nocturnal dyspnea. Dermatological: No rash, lesions/masses Respiratory: No  cough, dyspnea Urologic: No hematuria, dysuria Abdominal:   No nausea, vomiting, diarrhea, bright red blood per rectum, melena, or hematemesis Neurologic:  No visual changes, wkns, changes in mental status. All other systems reviewed and are otherwise negative except as noted above.     Physical Exam    VS:  LMP 07/10/2002  , BMI There is no height or weight on file to calculate BMI.     GEN: Well nourished, well developed, in no acute distress. HEENT: normal. Neck: Supple, no JVD, carotid bruits, or masses. Cardiac: RRR, no murmurs, rubs, or gallops. No clubbing, cyanosis, edema.  Radials/DP/PT 2+ and equal bilaterally.  Respiratory:  Respirations regular and unlabored, clear to auscultation bilaterally. GI: Soft, nontender, nondistended, BS + x 4. MS: no deformity or atrophy. Skin: warm and dry, no rash. Neuro:  Strength and sensation are intact. Psych: Normal affect.  Accessory Clinical Findings    ECG personally reviewed by me today- *** - No acute changes  No results found for: "WBC", "HGB", "HCT", "MCV", "PLT" Lab Results  Component Value Date   CREATININE 0.81 06/20/2016   BUN 12 06/20/2016   NA 141 06/20/2016   K 4.5 06/20/2016   CL 108 06/20/2016   CO2 25 06/20/2016   Lab Results  Component Value Date   ALT 15 06/20/2016   AST 21 06/20/2016   ALKPHOS 70 06/20/2016   BILITOT 0.2 06/20/2016   No results found for: "CHOL", "HDL", "LDLCALC", "LDLDIRECT", "TRIG", "CHOLHDL"  No results found for: "HGBA1C"  Review of Prior Studies: NM Stress Test 10/18/2021 The study is normal. The study is low risk.   No ST deviation was noted.   LV perfusion is abnormal. Defect 1: There is a small defect with mild reduction in uptake present in the mid anterior location(s) that is fixed. There is normal wall motion in the defect area. Consistent with artifact caused by breast attenuation. The artifact is seen during supine imaging, not during upright imaging.   Left ventricular  function is normal. Nuclear stress EF: 76 %. The left ventricular ejection fraction is hyperdynamic (>65%). End diastolic cavity size is normal.   Prior study not available for comparison.  Echocardiogram 08/30/2021 1. Left ventricular ejection fraction, by estimation, is 60 to 65%. The  left ventricle has normal function. The left ventricle has no regional  wall motion abnormalities. Left ventricular diastolic parameters were  normal.   2. Right ventricular systolic function is normal. The right ventricular  size is normal. There is normal pulmonary artery systolic pressure. The  estimated right ventricular systolic pressure is 28.2 mmHg.   3. The mitral valve is normal in structure. No evidence of mitral valve  regurgitation. No evidence of mitral stenosis.   4. The aortic valve is tricuspid. Aortic  valve regurgitation is not  visualized. No aortic stenosis is present.   5. The inferior vena cava is dilated in size with >50% respiratory  variability, suggesting right atrial pressure of 8 mmHg.    Assessment & Plan   1.  ***    Current medicines are reviewed at length with the patient today.  I have spent *** min's  dedicated to the care of this patient on the date of this encounter to include pre-visit review of records, assessment, management and diagnostic testing,with shared decision making. Signed, Bettey Mare. Liborio Nixon, ANP, AACC   07/05/2022 10:38 AM      Office [phone] Fax [phone]  Notice: This dictation was prepared with Dragon dictation along with smaller phrase technology. Any transcriptional errors that result from this process are unintentional and may not be corrected upon review.

## 2022-07-07 ENCOUNTER — Ambulatory Visit: Payer: Self-pay | Admitting: Adult Health

## 2022-07-18 ENCOUNTER — Ambulatory Visit: Payer: 59 | Admitting: Psychologist

## 2022-07-20 ENCOUNTER — Ambulatory Visit: Payer: 59 | Admitting: Psychologist

## 2022-07-21 ENCOUNTER — Encounter: Payer: Self-pay | Admitting: Obstetrics and Gynecology

## 2022-07-24 ENCOUNTER — Ambulatory Visit (INDEPENDENT_AMBULATORY_CARE_PROVIDER_SITE_OTHER): Payer: 59 | Admitting: Psychologist

## 2022-07-24 DIAGNOSIS — F4521 Hypochondriasis: Secondary | ICD-10-CM

## 2022-07-24 NOTE — Progress Notes (Signed)
[facility] Counselor/Therapist Progress Note  Patient ID: Jacqueline Silva, MRN: [id number],    Date: 07/24/2022  Time Spent: 10:03 am to 10:46 am; total time: 43 minutes   This session was held via in person. The patient consented to in-person therapy and was in the clinician's office. Limits of confidentiality were discussed with the patient.    Treatment Type: Individual Therapy  Reported Symptoms: Anxiety is less  Mental Status Exam: Appearance:  Well Groomed     Behavior: Appropriate  Motor: Normal  Speech/Language:  Clear and Coherent  Affect: Appropriate  Mood: normal  Thought process: normal  Thought content:   WNL  Sensory/Perceptual disturbances:   WNL  Orientation: oriented to person, place, and time/date  Attention: Good  Concentration: Good  Memory: WNL  Fund of knowledge:  Good  Insight:   Good  Judgment:  Good  Impulse Control: Good   Risk Assessment: Danger to Self:  No Self-injurious Behavior: No Danger to Others: No Duty to Warn:no Physical Aggression / Violence:No  Access to Firearms a concern: No  Gang Involvement:No   Subjective: Beginning the session, patient described herself as doing well voicing that coping strategies have helped. Continuing to talk, patient spent the session reflecting on fears she is experiencing related to thoughts of dying. She processed thoughts and emotions. She was agreeable to homework and following up. She denied suicidal and homicidal ideation.    Interventions:  Worked on developing a therapeutic relationship with the patient using active listening and reflective statements. Provided emotional support using empathy and validation. Reviewed the treatment plan with the patient. Praised the patient for doing better and explored what has assisted the patient. Reviewed the holidays. Identified goals for the session. Identified the theme of fear of dying. Spent the session reflecting and normalizing these fears.  Provided psychoeducation about a health decision maker, and a MOST form. Processed thoughts and emotions. Briefly explored the thought of "being a good enough" mother. Challenged some of the thoughts. Processed patient's faith and explored what this looked like. Used self-disclosure. Provided empathic statements. Assigned homework. Assessed for suicidal and homicidal ideation.   Homework: Talk with Wilfred Lacy (spouse) and daughter Overton Mam) about fears.   Next Session: Review homework and emotional support.   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  Plan:  Goals Work through the grieving process and face reality of own death Accept emotional support from others around them Live life to the fullest, event though time may be limited Become as knowledgeable about the medical condition  Reduce fear, anxiety about the health condition  Accept the illness Accept the role of psychological and behavioral factors  Stabilize anxiety level wile increasing ability to function Learn and implement coping skills that result in a reduction of anxiety  Alleviate depressive symptoms Recognize, accept, and cope with depressive feelings Develop healthy thinking patterns Develop healthy interpersonal relationships  Objectives target date for all objectives is 05/25/2023 Identify feelings associated with the illness Family members share with each other feelings Identify the losses or limitations that have been experienced Verbalize acceptance of the reality of the medical condition Commit to learning and implement a proactive approach to managing personal stresses Verbalize an understanding of the medical condition Work with therapist to develop a plan for coping with stress Learn and implement skills for managing stress Engage in social, productive activities that are possible Engage in faith based activities implement positive imagery Identify coping skills and sources of emotional support Patient's  partner and family members  verbalize their fears regarding severity of health condition Identify sources of emotional distress  Learning and implement calming skills to reduce overall anxiety Learn and implement problem solving strategies Identify and engage in pleasant activities Learning and implement personal and interpersonal skills to reduce anxiety and improve interpersonal relationships Learn to accept limitations in life and commit to tolerating, rather than avoiding, unpleasant emotions while accomplishing meaningful goals Identify major life conflicts from the past and present that form the basis for present anxiety Learn and implement behavioral strategies Verbalize an understanding and resolution of current interpersonal problems Learn and implement problem solving and decision making skills Learn and implement conflict resolution skills to resolve interpersonal problems Verbalize an understanding of healthy and unhealthy emotions verbalize insight into how past relationships may be influence current experiences with depression Use mindfulness and acceptance strategies and increase value based behavior  Increase hopeful statements about the future.   Interventions Teach about stress and ways to handle stress Assist the patient in developing a coping action plan for stressors Conduct skills based training for coping strategies Train problem focused skills Sort out what activities the individual can do Encourage patient to rely upon his/her spiritual faith Teach the patient to use guided imagery Probe and evaluate family's ability to provide emotional support Allow family to share their fears Assist the patient in identifying, sorting through, and verbalizing the various feelings generated by his/her medical condition Meet with family members  Ask patient list out limitations  Use stress inoculation training  Use Acceptance and Commitment Therapy to help client accept  uncomfortable realities in order to accomplish value-consistent goals Reinforce the client's insight into the role of his/her past emotional pain and present anxiety  Discuss examples demonstrating that unrealistic worry overestimates the probability of threats and underestimate patient's ability  Assist the patient in analyzing his or her worries Help patient understand that avoidance is reinforcing  Behavioral activation help the client explore the relationship, nature of the dispute,  Help the client develop new interpersonal skills and relationships Conduct Problem so living therapy Teach conflict resolution skills Use a process-experiential approach Conduct TLDP Conduct ACT  The patient and clinician reviewed the treatment plan on 06/14/2022. The patient approved of the treatment plan.    Conception Chancy, PsyD

## 2022-08-21 ENCOUNTER — Ambulatory Visit (INDEPENDENT_AMBULATORY_CARE_PROVIDER_SITE_OTHER): Payer: 59 | Admitting: Psychologist

## 2022-08-21 DIAGNOSIS — F4521 Hypochondriasis: Secondary | ICD-10-CM

## 2022-08-21 NOTE — Progress Notes (Signed)
[facility] Counselor/Therapist Progress Note  Patient ID: Jacqueline Silva, MRN: [id number],    Date: 08/21/2022  Time Spent: 10:01 am to 10:35 am; total time: 34 minutes   This session was held via video webex teletherapy due to the coronavirus risk at this time. The patient consented to video teletherapy and was located at her home during this session. She is aware it is the responsibility of the patient to secure confidentiality on her end of the session. The provider was in a private home office for the duration of this session. Limits of confidentiality were discussed with the patient.   Treatment Type: Individual Therapy  Reported Symptoms: Anxiety is less. Some fears regarding dying  Mental Status Exam: Appearance:  Well Groomed     Behavior: Appropriate  Motor: Normal  Speech/Language:  Clear and Coherent  Affect: Appropriate  Mood: normal  Thought process: normal  Thought content:   WNL  Sensory/Perceptual disturbances:   WNL  Orientation: oriented to person, place, and time/date  Attention: Good  Concentration: Good  Memory: WNL  Fund of knowledge:  Good  Insight:   Good  Judgment:  Good  Impulse Control: Good   Risk Assessment: Danger to Self:  No Self-injurious Behavior: No Danger to Others: No Duty to Warn:no Physical Aggression / Violence:No  Access to Firearms a concern: No  Gang Involvement:No   Subjective: Beginning the session, patient described herself as doing well voicing that coping strategies have helped. From there, she voiced still experiencing some fears related to dying. Patient spent the session reflecting on some of her fears. She processed thoughts and emotions. She was agreeable to homework and following up. She denied suicidal and homicidal ideation.    Interventions:  Worked on developing a therapeutic relationship with the patient using active listening and reflective statements. Provided emotional support using empathy and  validation. Reviewed the treatment plan with the patient. Reviewed events since the last session. Praised the patient for doing better and explored what has assisted. Identified goals for the session. Used examples from experience to assist the patient gain insight into self. Normalized challenges related to talking about dying. Used metaphors and analogies to assist the patient. Explored needs moving forward from counseling. Discussed next steps. Assigned homework. Assessed for suicidal and homicidal ideation.   Homework: Talk with Wilfred Lacy about fears  Next Session: Review homework and emotional support.   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  Plan:  Goals Work through the grieving process and face reality of own death Accept emotional support from others around them Live life to the fullest, event though time may be limited Become as knowledgeable about the medical condition  Reduce fear, anxiety about the health condition  Accept the illness Accept the role of psychological and behavioral factors  Stabilize anxiety level wile increasing ability to function Learn and implement coping skills that result in a reduction of anxiety  Alleviate depressive symptoms Recognize, accept, and cope with depressive feelings Develop healthy thinking patterns Develop healthy interpersonal relationships  Objectives target date for all objectives is 05/25/2023 Identify feelings associated with the illness Family members share with each other feelings Identify the losses or limitations that have been experienced Verbalize acceptance of the reality of the medical condition Commit to learning and implement a proactive approach to managing personal stresses Verbalize an understanding of the medical condition Work with therapist to develop a plan for coping with stress Learn and implement skills for managing stress Engage in social, productive activities that  are possible Engage in faith based  activities implement positive imagery Identify coping skills and sources of emotional support Patient's partner and family members verbalize their fears regarding severity of health condition Identify sources of emotional distress  Learning and implement calming skills to reduce overall anxiety Learn and implement problem solving strategies Identify and engage in pleasant activities Learning and implement personal and interpersonal skills to reduce anxiety and improve interpersonal relationships Learn to accept limitations in life and commit to tolerating, rather than avoiding, unpleasant emotions while accomplishing meaningful goals Identify major life conflicts from the past and present that form the basis for present anxiety Learn and implement behavioral strategies Verbalize an understanding and resolution of current interpersonal problems Learn and implement problem solving and decision making skills Learn and implement conflict resolution skills to resolve interpersonal problems Verbalize an understanding of healthy and unhealthy emotions verbalize insight into how past relationships may be influence current experiences with depression Use mindfulness and acceptance strategies and increase value based behavior  Increase hopeful statements about the future.   Interventions Teach about stress and ways to handle stress Assist the patient in developing a coping action plan for stressors Conduct skills based training for coping strategies Train problem focused skills Sort out what activities the individual can do Encourage patient to rely upon his/her spiritual faith Teach the patient to use guided imagery Probe and evaluate family's ability to provide emotional support Allow family to share their fears Assist the patient in identifying, sorting through, and verbalizing the various feelings generated by his/her medical condition Meet with family members  Ask patient list out  limitations  Use stress inoculation training  Use Acceptance and Commitment Therapy to help client accept uncomfortable realities in order to accomplish value-consistent goals Reinforce the client's insight into the role of his/her past emotional pain and present anxiety  Discuss examples demonstrating that unrealistic worry overestimates the probability of threats and underestimate patient's ability  Assist the patient in analyzing his or her worries Help patient understand that avoidance is reinforcing  Behavioral activation help the client explore the relationship, nature of the dispute,  Help the client develop new interpersonal skills and relationships Conduct Problem so living therapy Teach conflict resolution skills Use a process-experiential approach Conduct TLDP Conduct ACT  The patient and clinician reviewed the treatment plan on 06/14/2022. The patient approved of the treatment plan.    Conception Chancy, PsyD

## 2022-09-11 ENCOUNTER — Other Ambulatory Visit: Payer: Self-pay | Admitting: *Deleted

## 2022-09-11 ENCOUNTER — Encounter: Payer: Self-pay | Admitting: *Deleted

## 2022-09-11 DIAGNOSIS — I711 Thoracic aortic aneurysm, ruptured, unspecified: Secondary | ICD-10-CM

## 2022-10-05 ENCOUNTER — Other Ambulatory Visit: Payer: 59

## 2022-10-05 ENCOUNTER — Ambulatory Visit (INDEPENDENT_AMBULATORY_CARE_PROVIDER_SITE_OTHER): Payer: 59

## 2022-10-05 DIAGNOSIS — I711 Thoracic aortic aneurysm, ruptured, unspecified: Secondary | ICD-10-CM | POA: Diagnosis not present

## 2022-10-05 MED ORDER — IOHEXOL 300 MG/ML  SOLN
100.0000 mL | Freq: Once | INTRAMUSCULAR | Status: AC | PRN
  Administered 2022-10-05: 100 mL via INTRAVENOUS

## 2022-10-06 ENCOUNTER — Other Ambulatory Visit: Payer: Self-pay

## 2022-10-06 DIAGNOSIS — R911 Solitary pulmonary nodule: Secondary | ICD-10-CM

## 2022-10-09 ENCOUNTER — Ambulatory Visit: Payer: 59 | Admitting: Psychologist

## 2022-11-02 NOTE — Progress Notes (Signed)
HPI: Fu palpitations, coronary artery disease. Echocardiogram February 2023 showed normal LV function. Calcium score March 2023 346 which was 96 percentile and dilated ascending aorta at 40 mm.  Nuclear study April 2023 showed ejection fraction 76%, breast attenuation but no ischemia.  CTA March 2024 showed ectatic ascending aorta at 3.8 cm, 7 mm left upper lobe lung nodule and follow-up noncontrast chest CT recommended at 6 to 12 months.  Since last seen the patient denies any dyspnea on exertion, orthopnea, PND, pedal edema, palpitations, syncope or chest pain.   Current Outpatient Medications  Medication Sig Dispense Refill   aspirin EC 81 MG tablet Take 1 tablet (81 mg total) by mouth daily. Swallow whole. 90 tablet 3   atorvastatin (LIPITOR) 10 MG tablet Take 1 tablet (10 mg total) by mouth daily. 90 tablet 3   denosumab (PROLIA) 60 MG/ML SOSY injection Inject 60 mg into the skin every 6 (six) months.     Estradiol 10 MCG TABS vaginal tablet Place 1 tablet (10 mcg total) vaginally 2 (two) times a week. 24 tablet 3   ezetimibe (ZETIA) 10 MG tablet Take 1 tablet (10 mg total) by mouth daily. 90 tablet 3   FLUoxetine (PROZAC) 10 MG capsule Take 10 mg by mouth daily.     hydroquinone 4 % cream daily.     itraconazole (SPORANOX) 100 MG capsule Take 100 mg by mouth as directed. Take as directed     nitrofurantoin, macrocrystal-monohydrate, (MACROBID) 100 MG capsule Take one tablet orally after intercourse. 30 capsule 2   tretinoin (RETIN-A) 0.05 % cream Apply 1 application. topically at bedtime.     valACYclovir (VALTREX) 500 MG tablet TAKE 1 TABLET BY MOUTH TWICE DAILY FOR 3 DAYS WITH SYMPTOMS 30 tablet 2   No current facility-administered medications for this visit.     Past Medical History:  Diagnosis Date   ASCUS on Pap smear 07/11/1995   Hemorrhoid    HSV-2 (herpes simplex virus 2) infection 10/09/2003   Hyperlipidemia    Osteoporosis 03/10/2012   Vitamin D deficiency      Past Surgical History:  Procedure Laterality Date   BREAST BIOPSY  1986   ESOPHAGOGASTRODUODENOSCOPY     Dr. Loreta Ave    Social History   Socioeconomic History   Marital status: Married    Spouse name: Not on file   Number of children: 1   Years of education: Not on file   Highest education level: Not on file  Occupational History   Not on file  Tobacco Use   Smoking status: Never   Smokeless tobacco: Never  Vaping Use   Vaping Use: Never used  Substance and Sexual Activity   Alcohol use: Yes    Alcohol/week: 1.0 standard drink of alcohol    Types: 1 Standard drinks or equivalent per week    Comment: Occasional   Drug use: No   Sexual activity: Yes    Partners: Male    Birth control/protection: Post-menopausal  Other Topics Concern   Not on file  Social History Narrative   Not on file   Social Determinants of Health   Financial Resource Strain: Not on file  Food Insecurity: Not on file  Transportation Needs: Not on file  Physical Activity: Not on file  Stress: Not on file  Social Connections: Not on file  Intimate Partner Violence: Not on file    Family History  Problem Relation Age of Onset   Hyperlipidemia Mother  ROS: no fevers or chills, productive cough, hemoptysis, dysphasia, odynophagia, melena, hematochezia, dysuria, hematuria, rash, seizure activity, orthopnea, PND, pedal edema, claudication. Remaining systems are negative.  Physical Exam: Well-developed well-nourished in no acute distress.  Skin is warm and dry.  HEENT is normal.  Neck is supple.  Chest is clear to auscultation with normal expansion.  Cardiovascular exam is regular rate and rhythm.  Abdominal exam nontender or distended. No masses palpated. Extremities show no edema. neuro grossly intact   A/P  1 coronary artery disease/elevated calcium score-continue aspirin and statin.  Patient denies chest pain.  Follow-up nuclear study showed no ischemia.  2  hyperlipidemia-continue Lipitor at present dose.  3 palpitations-this is felt secondary to stress previously.  Her symptoms have improved.  Will consider monitor in the future if necessary.  4 dilated aortic root-follow-up CTA showed ectatic aorta at 3.8 cm.  5 lung nodule-we will plan follow-up noncontrast chest CT March 2025.  Olga Millers, MD

## 2022-11-09 ENCOUNTER — Encounter: Payer: Self-pay | Admitting: Cardiology

## 2022-11-09 ENCOUNTER — Ambulatory Visit: Payer: 59 | Attending: Adult Health | Admitting: Cardiology

## 2022-11-09 VITALS — BP 122/78 | HR 70 | Ht 64.5 in | Wt 151.0 lb

## 2022-11-09 DIAGNOSIS — I10 Essential (primary) hypertension: Secondary | ICD-10-CM

## 2022-11-09 DIAGNOSIS — E7800 Pure hypercholesterolemia, unspecified: Secondary | ICD-10-CM

## 2022-11-09 DIAGNOSIS — R911 Solitary pulmonary nodule: Secondary | ICD-10-CM

## 2022-11-09 DIAGNOSIS — R002 Palpitations: Secondary | ICD-10-CM

## 2022-11-09 DIAGNOSIS — I2510 Atherosclerotic heart disease of native coronary artery without angina pectoris: Secondary | ICD-10-CM | POA: Diagnosis not present

## 2022-11-09 NOTE — Patient Instructions (Signed)
  Follow-Up: At  [facility], you and your health needs are our priority.  As part of our continuing mission to provide you with exceptional heart care, we have created designated Provider Care Teams.  These Care Teams include your primary Cardiologist (physician) and Advanced Practice Providers (APPs -  Physician Assistants and Nurse Practitioners) who all work together to provide you with the care you need, when you need it.  We recommend signing up for the patient portal called "MyChart".  Sign up information is provided on this After Visit Summary.  MyChart is used to connect with patients for Virtual Visits (Telemedicine).  Patients are able to view lab/test results, encounter notes, upcoming appointments, etc.  Non-urgent messages can be sent to your provider as well.   To learn more about what you can do with MyChart, go to [email or web address].    Your next appointment:   12 month(s)  Provider:   Brian Crenshaw MD    

## 2022-12-05 ENCOUNTER — Other Ambulatory Visit: Payer: Self-pay | Admitting: Cardiology

## 2022-12-05 DIAGNOSIS — E7800 Pure hypercholesterolemia, unspecified: Secondary | ICD-10-CM

## 2022-12-22 NOTE — Progress Notes (Signed)
Last Mammogram: 07/19/22-negative Last Pap Smear:  07/16/18- negative Last Colon Screening;  *** Seat Belts:   *** Sun Screen:   *** Dental Check Up:  *** Brush & Floss:  ***

## 2022-12-25 ENCOUNTER — Encounter: Payer: Self-pay | Admitting: Obstetrics & Gynecology

## 2022-12-25 ENCOUNTER — Other Ambulatory Visit: Payer: Self-pay | Admitting: Obstetrics & Gynecology

## 2022-12-25 ENCOUNTER — Other Ambulatory Visit (HOSPITAL_COMMUNITY)
Admission: RE | Admit: 2022-12-25 | Discharge: 2022-12-25 | Disposition: A | Discharge status: Home / Self Care | Payer: 59 | Source: Ambulatory Visit | Attending: Obstetrics & Gynecology | Admitting: Obstetrics & Gynecology

## 2022-12-25 ENCOUNTER — Ambulatory Visit (INDEPENDENT_AMBULATORY_CARE_PROVIDER_SITE_OTHER): Payer: 59 | Admitting: Obstetrics & Gynecology

## 2022-12-25 VITALS — BP 118/81 | HR 73 | Ht 64.5 in | Wt 154.0 lb

## 2022-12-25 DIAGNOSIS — N942 Vaginismus: Secondary | ICD-10-CM | POA: Diagnosis not present

## 2022-12-25 DIAGNOSIS — A609 Anogenital herpesviral infection, unspecified: Secondary | ICD-10-CM

## 2022-12-25 DIAGNOSIS — Z01419 Encounter for gynecological examination (general) (routine) without abnormal findings: Secondary | ICD-10-CM | POA: Insufficient documentation

## 2022-12-25 MED ORDER — ESTRADIOL 10 MCG VA TABS
1.0000 | ORAL_TABLET | Freq: Every day | VAGINAL | 12 refills | Status: AC
Start: 2022-12-25 — End: ?

## 2022-12-25 MED ORDER — VALACYCLOVIR HCL 500 MG PO TABS: 500.0000 mg | ORAL_TABLET | Freq: Every day | ORAL | 12 refills | Status: DC

## 2022-12-25 NOTE — Progress Notes (Signed)
Subjective:     Jacqueline Silva is a 62 y.o. female here for a routine exam.  Current complaints: labia lesion--repeat HSV outbreak; get appox 3x a year.    Gynecologic History Patient's last menstrual period was 07/10/2002. Contraception: post menopausal status Last Mammogram: 07/19/22-negative Last Pap Smear:  07/16/18- negative Last Colon Screening: Sept. 9th, 2020 Seat Belts: Yes Sun Screen:Yes Dental Check Up: Yes Brush & Floss: Yes     Obstetric History OB History  Gravida Para Term Preterm AB Living  1 1 1     1   SAB IAB Ectopic Multiple Live Births               # Outcome Date GA Lbr Len/2nd Weight Sex Delivery Anes PTL Lv  1 Term              The following portions of the patient's history were reviewed and updated as appropriate: allergies, current medications, past family history, past medical history, past social history, past surgical history, and problem list.  Review of Systems Pertinent items noted in HPI and remainder of comprehensive ROS otherwise negative.    Objective:     Vitals:   12/25/22 0824  BP: 118/81  Pulse: 73  Weight: 154 lb (69.9 kg)  Height: 5' 4.5" (1.638 m)   Vitals:  WNL General appearance: alert, cooperative and no distress  HEENT: Normocephalic, without obvious abnormality, atraumatic Eyes: negative Throat: lips, mucosa, and tongue normal; teeth and gums normal  Respiratory: Clear to auscultation bilaterally  CV: Regular rate and rhythm  Breasts:  Normal appearance, no masses or tenderness, no nipple retraction or dimpling  GI: Soft, non-tender; bowel sounds normal; no masses,  no organomegaly  GU: External Genitalia:  Tanner V, lesion on perineum c/w HSV Urethra:  No prolapse   Vagina: Pink, normal rugae, no blood or discharge; atrophic  Cervix: No CMT, no lesion  Uterus:  Normal size and contour, non tender  Adnexa: Normal, no masses, non tender  Musculoskeletal: No edema, redness or tenderness in the calves or thighs   Skin: No lesions or rash  Lymphatic: Axillary adenopathy: none     Psychiatric: Normal mood and behavior        Assessment:    Healthy female exam.    Plan:    1.  Pap with cotesting 2.Yearly mammograms ([facility]) 3.  HSV outbreak--Valtrex bid then daily supression 4.  Atrohpic vaginitis--increase vagifem to nightly for 2 weeks then twice a week. 5.  Vaginismus--Pelvic PT and re eval in 8 weeks

## 2022-12-26 LAB — CYTOLOGY - PAP
Comment: NEGATIVE
Diagnosis: NEGATIVE
High risk HPV: NEGATIVE

## 2022-12-27 ENCOUNTER — Other Ambulatory Visit: Payer: Self-pay

## 2022-12-27 ENCOUNTER — Ambulatory Visit: Payer: 59 | Attending: Obstetrics & Gynecology | Admitting: Physical Therapy

## 2022-12-27 ENCOUNTER — Encounter: Payer: Self-pay | Admitting: Physical Therapy

## 2022-12-27 DIAGNOSIS — Z01419 Encounter for gynecological examination (general) (routine) without abnormal findings: Secondary | ICD-10-CM | POA: Diagnosis not present

## 2022-12-27 DIAGNOSIS — M62838 Other muscle spasm: Secondary | ICD-10-CM | POA: Insufficient documentation

## 2022-12-27 NOTE — Therapy (Signed)
OUTPATIENT PHYSICAL THERAPY FEMALE PELVIC EVALUATION   Patient Name: Jacqueline Silva MRN: [id number] DOB:22-Aug-1960, 62 y.o., female Today's Date: 12/27/2022  END OF SESSION:  PT End of Session - 12/27/22 1732     Visit Number 1    Date for PT Re-Evaluation 03/21/23    Authorization Type UHC    PT Start Time 1235    PT Stop Time 1316    PT Time Calculation (min) 41 min    Activity Tolerance Patient tolerated treatment well;Patient limited by pain    Behavior During Therapy [facility] for tasks assessed/performed             Past Medical History:  Diagnosis Date   ASCUS on Pap smear 07/11/1995   Hemorrhoid    HSV-2 (herpes simplex virus 2) infection 10/09/2003   Hyperlipidemia    Osteoporosis 03/10/2012   Vitamin D deficiency    Past Surgical History:  Procedure Laterality Date   BREAST BIOPSY  1986   ESOPHAGOGASTRODUODENOSCOPY     Dr. Loreta Ave   Patient Active Problem List   Diagnosis Date Noted   Vaginismus 12/25/2022   Colon cancer screening 12/13/2021   Gastroesophageal reflux disease 11/24/2021   Osteoporosis 07/08/2014    PCP: Shelly Flatten, MD  REFERRING PROVIDER: Lesly Dukes, MD   REFERRING DIAG: pain  THERAPY DIAG:  Other muscle spasm  Rationale for Evaluation and Treatment: Rehabilitation  ONSET DATE: at least 2 years  SUBJECTIVE:                                                                                                                                                                                           SUBJECTIVE STATEMENT: It was very painful doing my annual exam.  I use vagifemme 2x/ week.  That is even painful to insert Fluid intake: Yes: 40 oz/day water plus a hot tea    PAIN:  Are you having pain? Yes (with insertion) NPRS scale: 4/10 (moderate) Pain location: Vaginal  Pain type: just hurts and feels tight Pain description: intermittent   Aggravating factors: anything inserted vaginally Relieving factors: not  sure  PRECAUTIONS: None  WEIGHT BEARING RESTRICTIONS: No  FALLS:  Has patient fallen in last 6 months? No  LIVING ENVIRONMENT: Lives with: lives with their spouse Lives in: House/apartment   OCCUPATION: quality assurance auditor; sitting  PLOF: Independent  PATIENT GOALS: be able to have vaginal penetration (wellness exam/intercourse) without pain  PERTINENT HISTORY:  Vaginal deliveryx1 Sexual abuse: No  BOWEL MOVEMENT: Pain with bowel movement: No No problems very normal no strain  URINATION: Pain with urination: No Fully empty bladder: Yes:   Rarely  leaks with sneeze  INTERCOURSE: Pain with intercourse:  yes Ability to have vaginal penetration:  No Climax:  Marinoff Scale: 3/3  PREGNANCY: Vaginal deliveries 1   PROLAPSE: None   OBJECTIVE:   DIAGNOSTIC FINDINGS:    PATIENT SURVEYS:    PFIQ-7 =   COGNITION: Overall cognitive status: Within functional limits for tasks assessed     SENSATION: Light touch: Appears intact Proprioception: Appears intact  MUSCLE LENGTH: Hamstrings: WFL  Thomas test: 75%  LUMBAR SPECIAL TESTS:    FUNCTIONAL TESTS:  Single leg stand normal  GAIT:   POSTURE: flexed trunk   PELVIC ALIGNMENT:  LUMBARAROM/PROM:  A/PROM A/PROM  eval  Flexion WFL   Extension   Right lateral flexion   Left lateral flexion   Right rotation   Left rotation    (Blank rows = not tested)  LOWER EXTREMITY ROM:  Passive ROM Right eval Left eval  Hip flexion    Hip extension    Hip abduction    Hip adduction    Hip internal rotation 75% 75%  Hip external rotation 75% 80%  Knee flexion    Knee extension    Ankle dorsiflexion    Ankle plantarflexion    Ankle inversion    Ankle eversion     (Blank rows = not tested)  LOWER EXTREMITY MMT:  MMT Right eval Left eval  Hip flexion    Hip extension    Hip abduction    Hip adduction    Hip internal rotation    Hip external rotation    Knee flexion    Knee  extension    Ankle dorsiflexion    Ankle plantarflexion    Ankle inversion    Ankle eversion     PALPATION:   General                  External Perineal Exam elevated perineal body                             Internal Pelvic Floor +q-tip right introitus; pressure and just turning pad of finger in vaginal canal to one knuckle deep was painful on Rt, initially pain on left but able to ease with diaphragmatic breathing, initially tightened with inhale and needed cues to do correctly  Patient confirms identification and approves PT to assess internal pelvic floor and treatment Yes  PELVIC MMT:   MMT eval  Vaginal Not tested due to high tone and pain  Internal Anal Sphincter   External Anal Sphincter   Puborectalis   Diastasis Recti   (Blank rows = not tested)        TONE: high  PROLAPSE: no  TODAY'S TREATMENT:                                                                                                                              DATE: 12/27/22 EVAL and initial  HEP   PATIENT EDUCATION:  Education details: Access Code: [email or web address] and moisturizing techniques Person educated: Patient Education method: Explanation, Demonstration, Verbal cues, and Handouts Education comprehension: verbalized understanding and returned demonstration  HOME EXERCISE PROGRAM: Access Code: [email or web address] URL: [email or web address] Date: 12/27/2022 Prepared by: Dwana Curd  Exercises - Supine Hip Internal and External Rotation  - 1 x daily - 7 x weekly - 1 sets - 10 reps - 5 sec hold - Supine Piriformis Stretch  - 1 x daily - 7 x weekly - 1 sets - 3 reps - 30 sec hold - Diaphragmatic Breathing at 90/90 Supported  - 1 x daily - 7 x weekly - 1 sets - 10 reps - Standing Hip Flexor Stretch  - 3 x daily - 7 x weekly - 1 sets - 2 reps - 30 sec hold  ASSESSMENT:  CLINICAL IMPRESSION: Patient is a 62 y.o. female who was seen today for physical therapy evaluation and treatment for  pelvic pain and dyspareunia.  Pelvic exams have become very painful.  Pt never knew there was PT until recently and is now interested in becoming sexually active again if this is possible.  Has not been due to severe pain and muscle guarding due to pain. Pt has mild pain around introitus on Rt side with qtip.  Pt tolerates finger to one knuckle deep in vaginal canal but is very painful with any pressure or movement  on Rt side as she is hypersensitive. Left side is not bad with light pressure. Tight levators present bilaterally. Pt was educated on deep breathing and bulging as well as applying moisturizers. Pt has tension in hips Rt>Lt.  Pt will benefit from skilled PT to address all above mentioned impairments to restore functional activities without pain.  OBJECTIVE IMPAIRMENTS: decreased coordination, increased muscle spasms, impaired flexibility, impaired sensation, and pain.   ACTIVITY LIMITATIONS: continence and small amount sneezing at times  PARTICIPATION LIMITATIONS: interpersonal relationship  PERSONAL FACTORS: Time since onset of injury/illness/exacerbation are also affecting patient's functional outcome.   REHAB POTENTIAL: Excellent  CLINICAL DECISION MAKING: Stable/uncomplicated  EVALUATION COMPLEXITY: Low   GOALS: Goals reviewed with patient? Yes  SHORT TERM GOALS: Target date: 01/24/23  Ind with initial HEP Baseline:   LONG TERM GOALS: Target date: 03/21/23  Pt will be independent with advanced HEP to maintain improvements made throughout therapy  Baseline:  Goal status: INITIAL  2.  Pt ind with using dilators for full return to penetrative intercourse with spouse. Baseline:  Goal status: INITIAL  3.  Pt will be abe to tolerate complete internal assessment with one gloved finger without pain or muscle guarding Baseline:  Goal status: INITIAL   PLAN:  PT FREQUENCY: 1x/week  PT DURATION: 12 weeks  PLANNED INTERVENTIONS: Therapeutic exercises, Therapeutic  activity, Neuromuscular re-education, Balance training, Gait training, Patient/Family education, Self Care, Joint mobilization, Dry Needling, Electrical stimulation, Cryotherapy, Moist heat, Taping, Biofeedback, Manual therapy, and Re-evaluation  PLAN FOR NEXT SESSION: manual to lower abdominals, RUSI to work on down training, internal STM as tolerated, f/u on pt ability to do moisturizing   [employer], PT 12/27/2022, 5:46 PM

## 2023-01-31 NOTE — Therapy (Unsigned)
OUTPATIENT PHYSICAL THERAPY FEMALE PELVIC Treatment   Patient Name: Jacqueline Silva MRN: [id number] DOB:January 30, 1961, 62 y.o., female Today's Date: 02/01/2023  END OF SESSION:  PT End of Session - 02/01/23 1107     Visit Number 2    Date for PT Re-Evaluation 03/21/23    Authorization Type UHC    PT Start Time 1102    PT Stop Time 1142    PT Time Calculation (min) 40 min    Activity Tolerance Patient tolerated treatment well    Behavior During Therapy [facility] for tasks assessed/performed              Past Medical History:  Diagnosis Date   ASCUS on Pap smear 07/11/1995   Hemorrhoid    HSV-2 (herpes simplex virus 2) infection 10/09/2003   Hyperlipidemia    Osteoporosis 03/10/2012   Vitamin D deficiency    Past Surgical History:  Procedure Laterality Date   BREAST BIOPSY  1986   ESOPHAGOGASTRODUODENOSCOPY     Dr. Loreta Ave   Patient Active Problem List   Diagnosis Date Noted   Vaginismus 12/25/2022   Colon cancer screening 12/13/2021   Gastroesophageal reflux disease 11/24/2021   Osteoporosis 07/08/2014    PCP: Shelly Flatten, MD  REFERRING PROVIDER: Lesly Dukes, MD   REFERRING DIAG: pain  THERAPY DIAG:  Other muscle spasm  Rationale for Evaluation and Treatment: Rehabilitation  ONSET DATE: at least 2 years  SUBJECTIVE:                                                                                                                                                                                           SUBJECTIVE STATEMENT: I did the moisturizing and vagifemme 2x/ week was less painful to insert. Fluid intake: Yes: 40 oz/day water plus a hot tea    PAIN:  Are you having pain? Yes (with insertion)   PRECAUTIONS: None  WEIGHT BEARING RESTRICTIONS: No  FALLS:  Has patient fallen in last 6 months? No  LIVING ENVIRONMENT: Lives with: lives with their spouse Lives in: House/apartment   OCCUPATION: quality assurance auditor; sitting  PLOF:  Independent  PATIENT GOALS: be able to have vaginal penetration (wellness exam/intercourse) without pain  PERTINENT HISTORY:  Vaginal deliveryx1 Sexual abuse: No  BOWEL MOVEMENT: Pain with bowel movement: No No problems very normal no strain  URINATION: Pain with urination: No Fully empty bladder: Yes:   Rarely leaks with sneeze  INTERCOURSE: Pain with intercourse:  yes Ability to have vaginal penetration:  No Climax:  Marinoff Scale: 3/3  PREGNANCY: Vaginal deliveries 1   PROLAPSE: None   OBJECTIVE:   DIAGNOSTIC FINDINGS:  PATIENT SURVEYS:    PFIQ-7 =   COGNITION: Overall cognitive status: Within functional limits for tasks assessed     SENSATION: Light touch: Appears intact Proprioception: Appears intact  MUSCLE LENGTH: Hamstrings: WFL  Thomas test: 75%  LUMBAR SPECIAL TESTS:    FUNCTIONAL TESTS:  Single leg stand normal  GAIT:   POSTURE: flexed trunk   PELVIC ALIGNMENT:  LUMBARAROM/PROM:  A/PROM A/PROM  eval  Flexion WFL   Extension   Right lateral flexion   Left lateral flexion   Right rotation   Left rotation    (Blank rows = not tested)  LOWER EXTREMITY ROM:  Passive ROM Right eval Left eval  Hip flexion    Hip extension    Hip abduction    Hip adduction    Hip internal rotation 75% 75%  Hip external rotation 75% 80%  Knee flexion    Knee extension    Ankle dorsiflexion    Ankle plantarflexion    Ankle inversion    Ankle eversion     (Blank rows = not tested)  LOWER EXTREMITY MMT:  MMT Right eval Left eval  Hip flexion    Hip extension    Hip abduction    Hip adduction    Hip internal rotation    Hip external rotation    Knee flexion    Knee extension    Ankle dorsiflexion    Ankle plantarflexion    Ankle inversion    Ankle eversion     PALPATION:   General                  External Perineal Exam elevated perineal body                             Internal Pelvic Floor +q-tip right introitus;  pressure and just turning pad of finger in vaginal canal to one knuckle deep was painful on Rt, initially pain on left but able to ease with diaphragmatic breathing, initially tightened with inhale and needed cues to do correctly  Patient confirms identification and approves PT to assess internal pelvic floor and treatment Yes  PELVIC MMT:   MMT eval  Vaginal Not tested due to high tone and pain  Internal Anal Sphincter   External Anal Sphincter   Puborectalis   Diastasis Recti   (Blank rows = not tested)        TONE: high  PROLAPSE: no  TODAY'S TREATMENT:                                                                                                                              DATE: 02/01/23 Manual: Fascial release around the ribcage Diaphragm and thoracic extensor soft tissue release  Exercises with breathing and bulging pelvic floor Butterfly Open book Side bending Knee chest Double knee to chest  12/27/22 EVAL and initial HEP   PATIENT EDUCATION:  Education  details: Access Code: [email or web address] and moisturizing techniques Person educated: Patient Education method: Explanation, Demonstration, Verbal cues, and Handouts Education comprehension: verbalized understanding and returned demonstration  HOME EXERCISE PROGRAM: Access Code: [email or web address] URL: [email or web address] Date: 12/27/2022 Prepared by: Dwana Curd  Exercises - Supine Hip Internal and External Rotation  - 1 x daily - 7 x weekly - 1 sets - 10 reps - 5 sec hold - Supine Piriformis Stretch  - 1 x daily - 7 x weekly - 1 sets - 3 reps - 30 sec hold - Diaphragmatic Breathing at 90/90 Supported  - 1 x daily - 7 x weekly - 1 sets - 10 reps - Standing Hip Flexor Stretch  - 3 x daily - 7 x weekly - 1 sets - 2 reps - 30 sec hold  ASSESSMENT:  CLINICAL IMPRESSION: Went over breathing into pelvic floor. Pt had improved diaphragmatic breathing today after soft tissue work and did well with  stretches. Pt educated on dilators and she will be purchasing these as this will be needed to return to intercourse with penetration.    OBJECTIVE IMPAIRMENTS: decreased coordination, increased muscle spasms, impaired flexibility, impaired sensation, and pain.   ACTIVITY LIMITATIONS: continence and small amount sneezing at times  PARTICIPATION LIMITATIONS: interpersonal relationship  PERSONAL FACTORS: Time since onset of injury/illness/exacerbation are also affecting patient's functional outcome.   REHAB POTENTIAL: Excellent  CLINICAL DECISION MAKING: Stable/uncomplicated  EVALUATION COMPLEXITY: Low   GOALS: Goals reviewed with patient? Yes  SHORT TERM GOALS: Target date: 01/24/23  Ind with initial HEP Baseline:   LONG TERM GOALS: Target date: 03/21/23  Pt will be independent with advanced HEP to maintain improvements made throughout therapy  Baseline:  Goal status: INITIAL  2.  Pt ind with using dilators for full return to penetrative intercourse with spouse. Baseline:  Goal status: INITIAL  3.  Pt will be abe to tolerate complete internal assessment with one gloved finger without pain or muscle guarding Baseline:  Goal status: INITIAL   PLAN:  PT FREQUENCY: 1x/week  PT DURATION: 12 weeks  PLANNED INTERVENTIONS: Therapeutic exercises, Therapeutic activity, Neuromuscular re-education, Balance training, Gait training, Patient/Family education, Self Care, Joint mobilization, Dry Needling, Electrical stimulation, Cryotherapy, Moist heat, Taping, Biofeedback, Manual therapy, and Re-evaluation  PLAN FOR NEXT SESSION: RUSI down train and then external and internal STM as tolerated and review dilators   Brayton Caves Abeeha Twist, PT 02/01/2023, 12:51 PM

## 2023-02-01 ENCOUNTER — Encounter: Payer: Self-pay | Admitting: Physical Therapy

## 2023-02-01 ENCOUNTER — Ambulatory Visit: Payer: 59 | Attending: Obstetrics & Gynecology | Admitting: Physical Therapy

## 2023-02-01 DIAGNOSIS — M62838 Other muscle spasm: Secondary | ICD-10-CM | POA: Diagnosis present

## 2023-02-05 ENCOUNTER — Encounter: Payer: Self-pay | Admitting: Physical Therapy

## 2023-02-05 ENCOUNTER — Ambulatory Visit: Payer: 59 | Admitting: Physical Therapy

## 2023-02-05 DIAGNOSIS — M62838 Other muscle spasm: Secondary | ICD-10-CM | POA: Diagnosis not present

## 2023-02-05 NOTE — Therapy (Signed)
OUTPATIENT PHYSICAL THERAPY FEMALE PELVIC Treatment   Patient Name: Jacqueline Silva MRN: [id number] DOB:[birth year], 62 y.o., female Today's Date: 02/05/2023  END OF SESSION:  PT End of Session - 02/05/23 0929     Visit Number 3    Date for PT Re-Evaluation 03/21/23    Authorization Type [employer]    PT Start Time 0846    PT Stop Time 0926    PT Time Calculation (min) 40 min    Activity Tolerance Patient tolerated treatment well    Behavior During Therapy [facility] for tasks assessed/performed               Past Medical History:  Diagnosis Date   ASCUS on Pap smear 07/11/1995   Hemorrhoid    HSV-2 (herpes simplex virus 2) infection 10/09/2003   Hyperlipidemia    Osteoporosis 03/10/2012   Vitamin D deficiency    Past Surgical History:  Procedure Laterality Date   BREAST BIOPSY  1986   ESOPHAGOGASTRODUODENOSCOPY     Dr. Loreta Ave   Patient Active Problem List   Diagnosis Date Noted   Vaginismus 12/25/2022   Colon cancer screening 12/13/2021   Gastroesophageal reflux disease 11/24/2021   Osteoporosis 07/08/2014    PCP: Shelly Flatten, MD  REFERRING PROVIDER: Lesly Dukes, MD   REFERRING DIAG: pain  THERAPY DIAG:  Other muscle spasm  Rationale for Evaluation and Treatment: Rehabilitation  ONSET DATE: at least 2 years  SUBJECTIVE:                                                                                                                                                                                           SUBJECTIVE STATEMENT: I got the dilators and can do the small one without a problem Fluid intake: Yes: 40 oz/day water plus a hot tea    PAIN:  Are you having pain? Yes (with insertion)   PRECAUTIONS: None  WEIGHT BEARING RESTRICTIONS: No  FALLS:  Has patient fallen in last 6 months? No  LIVING ENVIRONMENT: Lives with: lives with their spouse Lives in: House/apartment   OCCUPATION: quality assurance auditor; sitting  PLOF:  Independent  PATIENT GOALS: be able to have vaginal penetration (wellness exam/intercourse) without pain  PERTINENT HISTORY:  Vaginal deliveryx1 Sexual abuse: No  BOWEL MOVEMENT: Pain with bowel movement: No No problems very normal no strain  URINATION: Pain with urination: No Fully empty bladder: Yes:   Rarely leaks with sneeze  INTERCOURSE: Pain with intercourse:  yes Ability to have vaginal penetration:  No Climax:  Marinoff Scale: 3/3  PREGNANCY: Vaginal deliveries 1   PROLAPSE: None   OBJECTIVE:   DIAGNOSTIC  FINDINGS:    PATIENT SURVEYS:    PFIQ-7 =   COGNITION: Overall cognitive status: Within functional limits for tasks assessed     SENSATION: Light touch: Appears intact Proprioception: Appears intact  MUSCLE LENGTH: Hamstrings: WFL  Thomas test: 75%  LUMBAR SPECIAL TESTS:    FUNCTIONAL TESTS:  Single leg stand normal  GAIT:   POSTURE: flexed trunk   PELVIC ALIGNMENT:  LUMBARAROM/PROM:  A/PROM A/PROM  eval  Flexion WFL   Extension   Right lateral flexion   Left lateral flexion   Right rotation   Left rotation    (Blank rows = not tested)  LOWER EXTREMITY ROM:  Passive ROM Right eval Left eval  Hip flexion    Hip extension    Hip abduction    Hip adduction    Hip internal rotation 75% 75%  Hip external rotation 75% 80%  Knee flexion    Knee extension    Ankle dorsiflexion    Ankle plantarflexion    Ankle inversion    Ankle eversion     (Blank rows = not tested)  LOWER EXTREMITY MMT:  MMT Right eval Left eval  Hip flexion    Hip extension    Hip abduction    Hip adduction    Hip internal rotation    Hip external rotation    Knee flexion    Knee extension    Ankle dorsiflexion    Ankle plantarflexion    Ankle inversion    Ankle eversion     PALPATION:   General                  External Perineal Exam elevated perineal body                             Internal Pelvic Floor +q-tip right introitus;  pressure and just turning pad of finger in vaginal canal to one knuckle deep was painful on Rt, initially pain on left but able to ease with diaphragmatic breathing, initially tightened with inhale and needed cues to do correctly  Patient confirms identification and approves PT to assess internal pelvic floor and treatment Yes  PELVIC MMT:   MMT eval  Vaginal Not tested due to high tone and pain  Internal Anal Sphincter   External Anal Sphincter   Puborectalis   Diastasis Recti   (Blank rows = not tested)        TONE: high  PROLAPSE: no  TODAY'S TREATMENT:                                                                                                                              DATE: 02/05/23 Manual: Fascial release around the ribcage Diaphragm and thoracic extensor soft tissue release  Exercises with breathing and bulging pelvic floor Butterfly Open book Side bending Knee chest Double knee to chest   02/01/23 Manual: Fascial release around the  ribcage Diaphragm and thoracic extensor soft tissue release  Exercises with breathing and bulging pelvic floor Butterfly Open book Side bending Knee chest Double knee to chest  12/27/22 EVAL and initial HEP   PATIENT EDUCATION:  Education details: Access Code: [email or web address] and moisturizing techniques Person educated: Patient Education method: Programmer, multimedia, Demonstration, Verbal cues, and Handouts Education comprehension: verbalized understanding and returned demonstration  HOME EXERCISE PROGRAM: Access Code: [email or web address] URL: [email or web address] Date: 12/27/2022 Prepared by: Dwana Curd  Exercises - Supine Hip Internal and External Rotation  - 1 x daily - 7 x weekly - 1 sets - 10 reps - 5 sec hold - Supine Piriformis Stretch  - 1 x daily - 7 x weekly - 1 sets - 3 reps - 30 sec hold - Diaphragmatic Breathing at 90/90 Supported  - 1 x daily - 7 x weekly - 1 sets - 10 reps - Standing Hip Flexor  Stretch  - 3 x daily - 7 x weekly - 1 sets - 2 reps - 30 sec hold  ASSESSMENT:  CLINICAL IMPRESSION: Pt did well with initially using dilators at home.  Today's session focused on using RUSI to be able to visualize the pelvic floor relaxing and bulging with breathing and stretches.  Pt was given vaginal dilator handout and reviewed progression of sizes when ready.  Pt will benefit from skilled PT to continue to work on soft tissue mobility.    OBJECTIVE IMPAIRMENTS: decreased coordination, increased muscle spasms, impaired flexibility, impaired sensation, and pain.   ACTIVITY LIMITATIONS: continence and small amount sneezing at times  PARTICIPATION LIMITATIONS: interpersonal relationship  PERSONAL FACTORS: Time since onset of injury/illness/exacerbation are also affecting patient's functional outcome.   REHAB POTENTIAL: Excellent  CLINICAL DECISION MAKING: Stable/uncomplicated  EVALUATION COMPLEXITY: Low   GOALS: Goals reviewed with patient? Yes  SHORT TERM GOALS: Target date: 01/24/23  Ind with initial HEP Baseline: Status : MET  LONG TERM GOALS: Target date: 03/21/23  Pt will be independent with advanced HEP to maintain improvements made throughout therapy  Baseline:  Goal status: IN PROGRESS  2.  Pt ind with using dilators for full return to penetrative intercourse with spouse. Baseline:  Goal status: IN PROGRESS  3.  Pt will be abe to tolerate complete internal assessment with one gloved finger without pain or muscle guarding Baseline:  Goal status: IN PROGRESS   PLAN:  PT FREQUENCY: 1x/week  PT DURATION: 12 weeks  PLANNED INTERVENTIONS: Therapeutic exercises, Therapeutic activity, Neuromuscular re-education, Balance training, Gait training, Patient/Family education, Self Care, Joint mobilization, Dry Needling, Electrical stimulation, Cryotherapy, Moist heat, Taping, Biofeedback, Manual therapy, and Re-evaluation  PLAN FOR NEXT SESSION: see how it is going with  dilators, internal soft tissue mobility as tolerated, lumbar and hip mobiltity   Jakki L Mikias Lanz, PT 02/05/2023, 9:30 AM

## 2023-02-20 ENCOUNTER — Ambulatory Visit: Payer: 59 | Attending: Obstetrics & Gynecology | Admitting: Physical Therapy

## 2023-02-20 DIAGNOSIS — M62838 Other muscle spasm: Secondary | ICD-10-CM | POA: Diagnosis present

## 2023-02-20 NOTE — Therapy (Signed)
OUTPATIENT PHYSICAL THERAPY FEMALE PELVIC Treatment   Patient Name: Jacqueline Silva MRN: [id number] DOB:04-20-1961, 62 y.o., female Today's Date: 02/20/2023  END OF SESSION:  PT End of Session - 02/20/23 0933     Visit Number 4    Date for PT Re-Evaluation 03/21/23    Authorization Type [employer]    PT Start Time 0933   arrival time   PT Stop Time 1012    PT Time Calculation (min) 39 min    Activity Tolerance Patient tolerated treatment well    Behavior During Therapy [facility] for tasks assessed/performed               Past Medical History:  Diagnosis Date   ASCUS on Pap smear 07/11/1995   Hemorrhoid    HSV-2 (herpes simplex virus 2) infection 10/09/2003   Hyperlipidemia    Osteoporosis 03/10/2012   Vitamin D deficiency    Past Surgical History:  Procedure Laterality Date   BREAST BIOPSY  1986   ESOPHAGOGASTRODUODENOSCOPY     Dr. Loreta Ave   Patient Active Problem List   Diagnosis Date Noted   Vaginismus 12/25/2022   Colon cancer screening 12/13/2021   Gastroesophageal reflux disease 11/24/2021   Osteoporosis 07/08/2014    PCP: Shelly Flatten, MD  REFERRING PROVIDER: Lesly Dukes, MD   REFERRING DIAG: pain  THERAPY DIAG:  Other muscle spasm  Rationale for Evaluation and Treatment: Rehabilitation  ONSET DATE: at least 2 years  SUBJECTIVE:                                                                                                                                                                                           SUBJECTIVE STATEMENT: Has been using the smallest dilator without pain, plans to use second size this week.   Fluid intake: Yes: 40 oz/day water plus a hot tea    PAIN:  Are you having pain? Yes (with insertion)   PRECAUTIONS: None  WEIGHT BEARING RESTRICTIONS: No  FALLS:  Has patient fallen in last 6 months? No  LIVING ENVIRONMENT: Lives with: lives with their spouse Lives in: House/apartment   OCCUPATION: quality assurance  auditor; sitting  PLOF: Independent  PATIENT GOALS: be able to have vaginal penetration (wellness exam/intercourse) without pain  PERTINENT HISTORY:  Vaginal deliveryx1 Sexual abuse: No  BOWEL MOVEMENT: Pain with bowel movement: No No problems very normal no strain  URINATION: Pain with urination: No Fully empty bladder: Yes:   Rarely leaks with sneeze  INTERCOURSE: Pain with intercourse:  yes Ability to have vaginal penetration:  No Climax:  Marinoff Scale: 3/3  PREGNANCY: Vaginal deliveries 1   PROLAPSE:  None   OBJECTIVE:   DIAGNOSTIC FINDINGS:    PATIENT SURVEYS:    PFIQ-7 =   COGNITION: Overall cognitive status: Within functional limits for tasks assessed     SENSATION: Light touch: Appears intact Proprioception: Appears intact  MUSCLE LENGTH: Hamstrings: WFL  Thomas test: 75%   FUNCTIONAL TESTS:  Single leg stand normal   POSTURE: flexed trunk   PELVIC ALIGNMENT:  LUMBARAROM/PROM:  A/PROM A/PROM  eval  Flexion WFL   Extension   Right lateral flexion   Left lateral flexion   Right rotation   Left rotation    (Blank rows = not tested)  LOWER EXTREMITY ROM:  Passive ROM Right eval Left eval  Hip flexion    Hip extension    Hip abduction    Hip adduction    Hip internal rotation 75% 75%  Hip external rotation 75% 80%  Knee flexion    Knee extension    Ankle dorsiflexion    Ankle plantarflexion    Ankle inversion    Ankle eversion     (Blank rows = not tested)  LOWER EXTREMITY MMT:  MMT Right eval Left eval  Hip flexion    Hip extension    Hip abduction    Hip adduction    Hip internal rotation    Hip external rotation    Knee flexion    Knee extension    Ankle dorsiflexion    Ankle plantarflexion    Ankle inversion    Ankle eversion     PALPATION:   General                  External Perineal Exam elevated perineal body                             Internal Pelvic Floor +q-tip right introitus; pressure  and just turning pad of finger in vaginal canal to one knuckle deep was painful on Rt, initially pain on left but able to ease with diaphragmatic breathing, initially tightened with inhale and needed cues to do correctly  Patient confirms identification and approves PT to assess internal pelvic floor and treatment Yes  PELVIC MMT:   MMT eval  Vaginal Not tested due to high tone and pain  Internal Anal Sphincter   External Anal Sphincter   Puborectalis   Diastasis Recti   (Blank rows = not tested)        TONE: high  PROLAPSE: no  TODAY'S TREATMENT:                                                                                                                              DATE:  02/20/23  Manual: addaday used L2 throughout bil hamstrings, adductors, gluteals, and paraspinals at thoracic and lumbar spine - pt cued throughout for pelvic drops, relaxation, and diaphragmatic breathing Therapeutic exercise: butterfly, unilateral happy baby, and lumbar rotations all bil  2x45s each  02/05/23 Manual: Fascial release around the ribcage Diaphragm and thoracic extensor soft tissue release  Exercises with breathing and bulging pelvic floor Butterfly Open book Side bending Knee chest Double knee to chest   PATIENT EDUCATION:  Education details: Access Code: [email or web address] and moisturizing techniques Person educated: Patient Education method: Explanation, Demonstration, Verbal cues, and Handouts Education comprehension: verbalized understanding and returned demonstration  HOME EXERCISE PROGRAM: Access Code: [email or web address] URL: [email or web address] Date: 12/27/2022 Prepared by: Dwana Curd  Exercises - Supine Hip Internal and External Rotation  - 1 x daily - 7 x weekly - 1 sets - 10 reps - 5 sec hold - Supine Piriformis Stretch  - 1 x daily - 7 x weekly - 1 sets - 3 reps - 30 sec hold - Diaphragmatic Breathing at 90/90 Supported  - 1 x daily - 7 x weekly - 1 sets - 10  reps - Standing Hip Flexor Stretch  - 3 x daily - 7 x weekly - 1 sets - 2 reps - 30 sec hold  ASSESSMENT:  CLINICAL IMPRESSION: Pt continues using dilators at home and reports no pain, plans to progress to size 2 this week.  Today's session focused on relaxation of pelvic floor and surrounding tissues and stretching at spine and hips for decreased tension at pelvic floor.  Pt will benefit from skilled PT to continue to work on soft tissue mobility.    OBJECTIVE IMPAIRMENTS: decreased coordination, increased muscle spasms, impaired flexibility, impaired sensation, and pain.   ACTIVITY LIMITATIONS: continence and small amount sneezing at times  PARTICIPATION LIMITATIONS: interpersonal relationship  PERSONAL FACTORS: Time since onset of injury/illness/exacerbation are also affecting patient's functional outcome.   REHAB POTENTIAL: Excellent  CLINICAL DECISION MAKING: Stable/uncomplicated  EVALUATION COMPLEXITY: Low   GOALS: Goals reviewed with patient? Yes  SHORT TERM GOALS: Target date: 01/24/23  Ind with initial HEP Baseline: Status : MET  LONG TERM GOALS: Target date: 03/21/23  Pt will be independent with advanced HEP to maintain improvements made throughout therapy  Baseline:  Goal status: IN PROGRESS  2.  Pt ind with using dilators for full return to penetrative intercourse with spouse. Baseline:  Goal status: IN PROGRESS  3.  Pt will be abe to tolerate complete internal assessment with one gloved finger without pain or muscle guarding Baseline:  Goal status: IN PROGRESS   PLAN:  PT FREQUENCY: 1x/week  PT DURATION: 12 weeks  PLANNED INTERVENTIONS: Therapeutic exercises, Therapeutic activity, Neuromuscular re-education, Balance training, Gait training, Patient/Family education, Self Care, Joint mobilization, Dry Needling, Electrical stimulation, Cryotherapy, Moist heat, Taping, Biofeedback, Manual therapy, and Re-evaluation  PLAN FOR NEXT SESSION: see how it is  going with dilators, internal soft tissue mobility as tolerated, lumbar and hip mobiltity   Barbaraann Faster, PT 02/20/2023, 10:19 AM

## 2023-03-06 ENCOUNTER — Ambulatory Visit: Payer: 59 | Admitting: Physical Therapy

## 2023-03-06 DIAGNOSIS — M62838 Other muscle spasm: Secondary | ICD-10-CM | POA: Diagnosis not present

## 2023-03-06 NOTE — Therapy (Addendum)
OUTPATIENT PHYSICAL THERAPY FEMALE PELVIC Treatment   Patient Name: Jacqueline Silva MRN: [id number] DOB:1960/11/03, 62 y.o., female Today's Date: 03/06/2023  END OF SESSION:  PT End of Session - 03/06/23 1152     Visit Number 5    Date for PT Re-Evaluation 07/06/23    Authorization Type [employer]    PT Start Time 1146    PT Stop Time 1230    PT Time Calculation (min) 44 min    Activity Tolerance Patient tolerated treatment well    Behavior During Therapy [facility] for tasks assessed/performed               Past Medical History:  Diagnosis Date   ASCUS on Pap smear 07/11/1995   Hemorrhoid    HSV-2 (herpes simplex virus 2) infection 10/09/2003   Hyperlipidemia    Osteoporosis 03/10/2012   Vitamin D deficiency    Past Surgical History:  Procedure Laterality Date   BREAST BIOPSY  1986   ESOPHAGOGASTRODUODENOSCOPY     Dr. Loreta Ave   Patient Active Problem List   Diagnosis Date Noted   Vaginismus 12/25/2022   Colon cancer screening 12/13/2021   Gastroesophageal reflux disease 11/24/2021   Osteoporosis 07/08/2014    PCP: Shelly Flatten, MD  REFERRING PROVIDER: Lesly Dukes, MD   REFERRING DIAG: pain  THERAPY DIAG:  Other muscle spasm  Rationale for Evaluation and Treatment: Rehabilitation  ONSET DATE: at least 2 years  SUBJECTIVE:                                                                                                                                                                                           SUBJECTIVE STATEMENT: Plans to start third size tonight but has been doing well with size 2. Pleased with progress with this so far. Denies pain with dilator use.   Fluid intake: Yes: 40 oz/day water plus a hot tea    PAIN:  Are you having pain? Yes (with insertion)   PRECAUTIONS: None  WEIGHT BEARING RESTRICTIONS: No  FALLS:  Has patient fallen in last 6 months? No  LIVING ENVIRONMENT: Lives with: lives with their spouse Lives in:  House/apartment   OCCUPATION: quality assurance auditor; sitting  PLOF: Independent  PATIENT GOALS: be able to have vaginal penetration (wellness exam/intercourse) without pain  PERTINENT HISTORY:  Vaginal deliveryx1 Sexual abuse: No  BOWEL MOVEMENT: Pain with bowel movement: No No problems very normal no strain  URINATION: Pain with urination: No Fully empty bladder: Yes:   Rarely leaks with sneeze  INTERCOURSE: Pain with intercourse:  yes Ability to have vaginal penetration:  No Climax:  Marinoff Scale: 3/3  PREGNANCY: Vaginal deliveries 1   PROLAPSE: None   OBJECTIVE:   DIAGNOSTIC FINDINGS:    PATIENT SURVEYS:     COGNITION: Overall cognitive status: Within functional limits for tasks assessed     SENSATION: Light touch: Appears intact Proprioception: Appears intact  MUSCLE LENGTH: Hamstrings: WFL  Thomas test: 75%   FUNCTIONAL TESTS:  Single leg stand normal   POSTURE: flexed trunk   PELVIC ALIGNMENT:  LUMBARAROM/PROM:  A/PROM A/PROM  eval  Flexion WFL   Extension   Right lateral flexion   Left lateral flexion   Right rotation   Left rotation    (Blank rows = not tested)  LOWER EXTREMITY ROM:  Passive ROM Right eval Left eval  Hip flexion    Hip extension    Hip abduction    Hip adduction    Hip internal rotation 75% 75%  Hip external rotation 75% 80%  Knee flexion    Knee extension    Ankle dorsiflexion    Ankle plantarflexion    Ankle inversion    Ankle eversion     (Blank rows = not tested)  LOWER EXTREMITY MMT:  MMT Right eval Left eval  Hip flexion    Hip extension    Hip abduction    Hip adduction    Hip internal rotation    Hip external rotation    Knee flexion    Knee extension    Ankle dorsiflexion    Ankle plantarflexion    Ankle inversion    Ankle eversion     PALPATION:   General                  External Perineal Exam elevated perineal body                             Internal  Pelvic Floor +q-tip right introitus; pressure and just turning pad of finger in vaginal canal to one knuckle deep was painful on Rt, initially pain on left but able to ease with diaphragmatic breathing, initially tightened with inhale and needed cues to do correctly  Patient confirms identification and approves PT to assess internal pelvic floor and treatment Yes  PELVIC MMT:   MMT eval  Vaginal Not tested due to high tone and pain  Internal Anal Sphincter   External Anal Sphincter   Puborectalis   Diastasis Recti   (Blank rows = not tested)        TONE: high  PROLAPSE: no  TODAY'S TREATMENT:                                                                                                                              DATE:  02/20/23  Manual: addaday used L2 throughout bil hamstrings, adductors, gluteals, and paraspinals at thoracic and lumbar spine - pt cued throughout for pelvic drops, relaxation, and diaphragmatic breathing Therapeutic exercise: butterfly, unilateral happy baby, and  lumbar rotations all bil 2x45s each  03/06/23: Initially asked pt about internal or external pelvic floor assessment today and pt reports this makes her stressed and tense "just thinking about it". Declined today reporting improvement with dilator and would like to continue with this at home and doesn't want to do this in clinic right now. PT Did not attempt further.  Manual: addaday used L2 throughout bil hamstrings, adductors, gluteals, and paraspinals lumbar spine - pt cued throughout for pelvic drops, relaxation, and diaphragmatic breathing X10 diaphragmatic breathing in hooklying  X10 pelvic drops with cues for visualization to help in relaxing pelvic floor Windshield wipers x5 20s each Unilateral happy baby 3x20s each      PATIENT EDUCATION:  Education details: Access Code: [email or web address] and moisturizing techniques Person educated: Patient Education method: Explanation, Demonstration, Verbal  cues, and Handouts Education comprehension: verbalized understanding and returned demonstration  HOME EXERCISE PROGRAM: Access Code: [email or web address] URL: [email or web address] Date: 12/27/2022 Prepared by: Dwana Curd  Exercises - Supine Hip Internal and External Rotation  - 1 x daily - 7 x weekly - 1 sets - 10 reps - 5 sec hold - Supine Piriformis Stretch  - 1 x daily - 7 x weekly - 1 sets - 3 reps - 30 sec hold - Diaphragmatic Breathing at 90/90 Supported  - 1 x daily - 7 x weekly - 1 sets - 10 reps - Standing Hip Flexor Stretch  - 3 x daily - 7 x weekly - 1 sets - 2 reps - 30 sec hold  ASSESSMENT:  CLINICAL IMPRESSION: Pt continues using dilators at home and reports no pain, plans to progress to size 3 this week.  Today's session focused on relaxation of pelvic floor and surrounding tissues and stretching at spine and hips for decreased tension at pelvic floor. Pt progressing toward goals. Does not want to do internal treatment at this time. Would like to space out next appointments as longer between to make sure she continues with progress before discharge. Pt will benefit from skilled PT to continue to work on soft tissue mobility.    OBJECTIVE IMPAIRMENTS: decreased coordination, increased muscle spasms, impaired flexibility, impaired sensation, and pain.   ACTIVITY LIMITATIONS: continence and small amount sneezing at times  PARTICIPATION LIMITATIONS: interpersonal relationship  PERSONAL FACTORS: Time since onset of injury/illness/exacerbation are also affecting patient's functional outcome.   REHAB POTENTIAL: Excellent  CLINICAL DECISION MAKING: Stable/uncomplicated  EVALUATION COMPLEXITY: Low   GOALS: Goals reviewed with patient? Yes  SHORT TERM GOALS: Target date: 01/24/23  Ind with initial HEP Baseline: Status : MET  LONG TERM GOALS: Target date: 03/21/23  Pt will be independent with advanced HEP to maintain improvements made throughout therapy   Baseline:  Goal status:MET  2.  Pt ind with using dilators for full return to penetrative intercourse with spouse. Baseline:  Goal status: IN PROGRESS  3.  Pt will be abe to tolerate complete internal assessment with one gloved finger without pain or muscle guarding Baseline:  Goal status: IN PROGRESS   PLAN:  PT FREQUENCY: 1x/week  PT DURATION: 12 weeks  PLANNED INTERVENTIONS: Therapeutic exercises, Therapeutic activity, Neuromuscular re-education, Balance training, Gait training, Patient/Family education, Self Care, Joint mobilization, Dry Needling, Electrical stimulation, Cryotherapy, Moist heat, Taping, Biofeedback, Manual therapy, and Re-evaluation  PLAN FOR NEXT SESSION: internal soft tissue mobility as tolerated, lumbar and hip mobiltity   Barbaraann Faster, PT 03/06/2023, 12:36 PM   PHYSICAL THERAPY DISCHARGE SUMMARY Visits: 5   As of 08/14/23, patient has not  returned for further visits and discharged from PT at this time.  Please see above for most recent PT update.  Patient agrees to discharge. Patient goals were partially met. Patient is being discharged due to not returning since the last visit.  Lorrene Reid, PT 08/14/23, 8:30 AM

## 2023-03-06 NOTE — Addendum Note (Signed)
Addended by: Barbaraann Faster on: 03/06/2023 12:57 PM   Modules accepted: Orders

## 2023-07-10 ENCOUNTER — Encounter: Payer: Self-pay | Admitting: Obstetrics & Gynecology

## 2023-07-24 ENCOUNTER — Encounter: Payer: Self-pay | Admitting: *Deleted

## 2023-08-20 ENCOUNTER — Other Ambulatory Visit: Payer: Self-pay | Admitting: Radiology

## 2023-09-28 ENCOUNTER — Other Ambulatory Visit: Payer: 59

## 2023-10-01 ENCOUNTER — Ambulatory Visit (INDEPENDENT_AMBULATORY_CARE_PROVIDER_SITE_OTHER): Payer: 59

## 2023-10-01 DIAGNOSIS — R911 Solitary pulmonary nodule: Secondary | ICD-10-CM | POA: Diagnosis not present

## 2023-10-03 ENCOUNTER — Encounter: Payer: Self-pay | Admitting: *Deleted

## 2023-10-30 NOTE — Progress Notes (Signed)
 HPI: Fu palpitations, coronary artery disease. Echocardiogram February 2023 showed normal LV function. Calcium  score March 2023 346 which was 96 percentile and dilated ascending aorta at 40 mm.  Nuclear study April 2023 showed ejection fraction 76%, breast attenuation but no ischemia.  CTA March 2024 showed ectatic ascending aorta at 3.8 cm, 7 mm left upper lobe lung nodule and follow-up noncontrast chest CT recommended at 6 to 12 months.  Chest CT March 2025 showed stable left upper lobe nodule and no further follow-up recommended.  Since last seen she denies dyspnea on exertion, recurrent palpitations or syncope.  Occasional sharp pain in her chest for 1 to 2 seconds but no exertional symptoms.  Current Outpatient Medications  Medication Sig Dispense Refill   aspirin  EC 81 MG tablet Take 1 tablet (81 mg total) by mouth daily. Swallow whole. 90 tablet 3   atorvastatin  (LIPITOR) 10 MG tablet Take 1 tablet (10 mg total) by mouth daily. Please keep scheduled appointment for future refills. Thank you. 30 tablet 0   denosumab  (PROLIA ) 60 MG/ML SOSY injection Inject 60 mg into the skin every 6 (six) months.     Estradiol  10 MCG TABS vaginal tablet Place 1 tablet (10 mcg total) vaginally daily. 30 tablet 12   ezetimibe  (ZETIA ) 10 MG tablet Take 1 tablet (10 mg total) by mouth daily. Please keep scheduled appointment for future refills. Thank you. 30 tablet 0   FLUoxetine (PROZAC) 10 MG capsule Take 10 mg by mouth daily.     hydroquinone 4 % cream daily.     itraconazole (SPORANOX) 100 MG capsule Take 100 mg by mouth as directed. Take as directed     tretinoin (RETIN-A) 0.05 % cream Apply 1 application. topically at bedtime.     valACYclovir  (VALTREX ) 500 MG tablet Take 1 tablet (500 mg total) by mouth daily. 30 tablet 12   nitrofurantoin , macrocrystal-monohydrate, (MACROBID ) 100 MG capsule Take one tablet orally after intercourse. (Patient not taking: Reported on 11/13/2023) 30 capsule 2   No current  facility-administered medications for this visit.     Past Medical History:  Diagnosis Date   ASCUS on Pap smear 07/11/1995   Hemorrhoid    HSV-2 (herpes simplex virus 2) infection 10/09/2003   Hyperlipidemia    Osteoporosis 03/10/2012   Vitamin D  deficiency     Past Surgical History:  Procedure Laterality Date   BREAST BIOPSY  1986   ESOPHAGOGASTRODUODENOSCOPY     Dr. Tova Fresh    Social History   Socioeconomic History   Marital status: Married    Spouse name: Not on file   Number of children: 1   Years of education: Not on file   Highest education level: Not on file  Occupational History   Not on file  Tobacco Use   Smoking status: Never   Smokeless tobacco: Never  Vaping Use   Vaping status: Never Used  Substance and Sexual Activity   Alcohol use: Yes    Alcohol/week: 1.0 standard drink of alcohol    Types: 1 Standard drinks or equivalent per week    Comment: Occasional   Drug use: No   Sexual activity: Yes    Partners: Male    Birth control/protection: Post-menopausal  Other Topics Concern   Not on file  Social History Narrative   Not on file   Social Drivers of Health   Financial Resource Strain: Not on file  Food Insecurity: Not on file  Transportation Needs: Not on file  Physical  Activity: Not on file  Stress: Not on file  Social Connections: Unknown (11/24/2021)   Received from [facility], [facility]   Social Network    Social Network: Not on file  Intimate Partner Violence: Unknown (11/24/2021)   [facility] from [facility]   HITS    Physically Hurt: Not on file    Insult or Talk Down To: Not on file    Threaten Physical Harm: Not on file    Scream or Curse: Not on file    Family History  Problem Relation Age of Onset   Hyperlipidemia Mother    Dementia Father     ROS: no fevers or chills, productive cough, hemoptysis, dysphasia, odynophagia, melena, hematochezia, dysuria, hematuria, rash, seizure activity, orthopnea,  PND, pedal edema, claudication. Remaining systems are negative.  Physical Exam: Well-developed well-nourished in no acute distress.  Skin is warm and dry.  HEENT is normal.  Neck is supple.  Chest is clear to auscultation with normal expansion.  Cardiovascular exam is regular rate and rhythm.  Abdominal exam nontender or distended. No masses palpated. Extremities show no edema. neuro grossly intact  EKG Interpretation Date/Time:  Tuesday Nov 13 2023 11:36:35 EDT Ventricular Rate:  61 PR Interval:  164 QRS Duration:  84 QT Interval:  410 QTC Calculation: 412 R Axis:   9  Text Interpretation: Normal sinus rhythm Normal ECG No previous ECGs available Confirmed by Alexandria Angel ([id number]) on 11/13/2023 11:37:54 AM    A/P  1 coronary artery disease/elevated calcium  score-Previous nuclear study showed no ischemia.  Continue aspirin  and statin.  2 Hyperlipidemia-continue statin.  Most recent LDL 72.  She had elevated liver functions on Lipitor previously at 40 mg.  Will try 20 mg.  Check lipids and liver as well as LP(a) in 8 weeks.  If liver functions are elevated we will likely refer for PCSK9 inhibitor and discontinue Lipitor.  3 palpitations-symptoms have resolved.  Can consider monitor or addition of beta-blocker in the future if needed.  4 history of dilated aortic root-follow-up CTA showed aorta ectasia at 3.8 cm.  5 history of lung nodule-most recent CT as outlined above and no follow-up felt indicated.  6 chest pain-occasional sharp pain in her chest for 1 to 2 seconds but no exertional symptoms and electrocardiogram normal.  I do not think this is likely cardiac related.  No plans for further cardiac evaluation.  Alexandria Angel, MD

## 2023-11-07 ENCOUNTER — Other Ambulatory Visit: Payer: Self-pay | Admitting: Cardiology

## 2023-11-07 DIAGNOSIS — E7800 Pure hypercholesterolemia, unspecified: Secondary | ICD-10-CM

## 2023-11-13 ENCOUNTER — Ambulatory Visit: Payer: 59 | Attending: Cardiology | Admitting: Cardiology

## 2023-11-13 ENCOUNTER — Encounter: Payer: Self-pay | Admitting: Cardiology

## 2023-11-13 VITALS — BP 110/74 | HR 72 | Ht 64.0 in | Wt 164.0 lb

## 2023-11-13 DIAGNOSIS — I2510 Atherosclerotic heart disease of native coronary artery without angina pectoris: Secondary | ICD-10-CM | POA: Diagnosis not present

## 2023-11-13 DIAGNOSIS — E7800 Pure hypercholesterolemia, unspecified: Secondary | ICD-10-CM | POA: Diagnosis not present

## 2023-11-13 DIAGNOSIS — R072 Precordial pain: Secondary | ICD-10-CM | POA: Diagnosis not present

## 2023-11-13 DIAGNOSIS — I10 Essential (primary) hypertension: Secondary | ICD-10-CM

## 2023-11-13 MED ORDER — ATORVASTATIN CALCIUM 20 MG PO TABS: 20.0000 mg | ORAL_TABLET | Freq: Every day | ORAL | 3 refills | Status: DC

## 2023-11-13 NOTE — Patient Instructions (Signed)
 Medication Instructions:   INCREASE ATORVASTATIN  TO 20 MG ONCE DAILY= 2 OF THE 10 MG TABLETS ONCE DAILY  *If you need a refill on your cardiac medications before your next appointment, please call your pharmacy*  Lab Work: Your physician recommends that you return for lab work in: 8 [facility]  If you have labs (blood work) drawn today and your tests are completely normal, you will receive your results only by: MyChart Message (if you have MyChart) OR A paper copy in the mail If you have any lab test that is abnormal or we need to change your treatment, we will call you to review the results.  Follow-Up: At [facility], you and your health needs are our priority.  As part of our continuing mission to provide you with exceptional heart care, our providers are all part of one team.  This team includes your primary Cardiologist (physician) and Advanced Practice Providers or APPs (Physician Assistants and Nurse Practitioners) who all work together to provide you with the care you need, when you need it.  Your next appointment:   12 month(s)  Provider:   Alexandria Angel, MD

## 2023-11-14 ENCOUNTER — Other Ambulatory Visit: Payer: Self-pay

## 2023-11-14 DIAGNOSIS — E7800 Pure hypercholesterolemia, unspecified: Secondary | ICD-10-CM

## 2023-11-14 MED ORDER — ATORVASTATIN CALCIUM 20 MG PO TABS: 20.0000 mg | ORAL_TABLET | Freq: Every day | ORAL | 3 refills | Status: AC

## 2023-12-07 ENCOUNTER — Other Ambulatory Visit: Payer: Self-pay | Admitting: Obstetrics & Gynecology

## 2023-12-17 ENCOUNTER — Other Ambulatory Visit: Payer: Self-pay

## 2023-12-17 DIAGNOSIS — E7800 Pure hypercholesterolemia, unspecified: Secondary | ICD-10-CM

## 2023-12-17 MED ORDER — EZETIMIBE 10 MG PO TABS: 10.0000 mg | ORAL_TABLET | Freq: Every day | ORAL | 3 refills | Status: AC

## 2023-12-25 ENCOUNTER — Telehealth: Payer: Self-pay | Admitting: *Deleted

## 2023-12-25 NOTE — Telephone Encounter (Signed)
Left patient a message to call and schedule. 

## 2024-01-14 ENCOUNTER — Encounter: Payer: Self-pay | Admitting: Obstetrics & Gynecology

## 2024-01-14 ENCOUNTER — Ambulatory Visit: Admitting: Obstetrics & Gynecology

## 2024-01-14 VITALS — BP 105/72 | HR 71 | Ht 64.0 in | Wt 166.1 lb

## 2024-01-14 DIAGNOSIS — Z01419 Encounter for gynecological examination (general) (routine) without abnormal findings: Secondary | ICD-10-CM | POA: Diagnosis not present

## 2024-01-14 DIAGNOSIS — F32A Depression, unspecified: Secondary | ICD-10-CM

## 2024-01-14 NOTE — Progress Notes (Signed)
 Last pap smear (date and result):12/25/22 Last mammogram (date and result):07/23/2023 Last colon screening (date and result):03/19/2019 Brush:yes Floss:yes Seatbelts: yes Sunscreen: yes

## 2024-01-14 NOTE — Progress Notes (Signed)
 Subjective:     Jacqueline Silva is a 63 y.o. female here for a routine exam.  Current complaints: depression symptoms--feels stresa dn guilt over management of aging parents.  Pt went to PT and vaginismus is better.  Not sexually active at this time.    Gynecologic History Patient's last menstrual period was 07/10/2002. Contraception: post menopausal status Last Pap: 2024. Results were: normal Last mammogram: 2025 Jan. Results were: normal  Obstetric History OB History  Gravida Para Term Preterm AB Living  1 1 1   1   SAB IAB Ectopic Multiple Live Births          # Outcome Date GA Lbr Len/2nd Weight Sex Type Anes PTL Lv  1 Term              The following portions of the patient's history were reviewed and updated as appropriate: allergies, current medications, past family history, past medical history, past social history, past surgical history, and problem list.  Review of Systems Pertinent items noted in HPI and remainder of comprehensive ROS otherwise negative.    Objective:     Vitals:   01/14/24 1006  BP: 105/72  Pulse: 71  Weight: 166 lb 1.9 oz (75.4 kg)  Height: 5' 4 (1.626 m)   Vitals:  WNL General appearance: alert, cooperative and no distress  HEENT: Normocephalic, without obvious abnormality, atraumatic Eyes: negative Throat: lips, mucosa, and tongue normal; teeth and gums normal  Respiratory: Clear to auscultation bilaterally  CV: Regular rate and rhythm  Breasts:  Normal appearance, no masses or tenderness, no nipple retraction or dimpling  GI: Soft, non-tender; bowel sounds normal; no masses,  no organomegaly  GU: External Genitalia:  Tanner V, no lesion Urethra:  No prolapse   Vagina: Pink, normal rugae, no blood or discharge  Cervix: No CMT, no lesion  Uterus:  Normal size and contour, non tender  Adnexa: Normal, no masses, non tender  Musculoskeletal: No edema, redness or tenderness in the calves or thighs  Skin: No lesions or rash  Lymphatic:  Axillary adenopathy: none     Psychiatric: Normal mood and behavior        Assessment:    Healthy female exam.    Plan:    Pap smear up to date. Colonoscopy due to age 41 Mammogram up to date Maximize calcium  and vitamin D  + weight bearing exercise; sees endocrinology for osteoporosis (on Prolia ) Depression sympotms--overwhelmed and guilty feeligs about taking care of parents and daughter. Not living to full potental.  Increase prozac  to 20 mg daily and refer to bounseling.

## 2024-01-21 ENCOUNTER — Encounter: Payer: Self-pay | Admitting: Obstetrics & Gynecology

## 2024-01-21 ENCOUNTER — Other Ambulatory Visit: Payer: Self-pay

## 2024-01-21 DIAGNOSIS — F32A Depression, unspecified: Secondary | ICD-10-CM

## 2024-01-21 DIAGNOSIS — A609 Anogenital herpesviral infection, unspecified: Secondary | ICD-10-CM

## 2024-01-21 MED ORDER — FLUOXETINE HCL 10 MG PO CAPS: 20.0000 mg | ORAL_CAPSULE | Freq: Every day | ORAL | 3 refills | Status: AC

## 2024-01-21 MED ORDER — VALACYCLOVIR HCL 500 MG PO TABS: 500.0000 mg | ORAL_TABLET | Freq: Every day | ORAL | 3 refills | Status: AC

## 2024-01-22 ENCOUNTER — Other Ambulatory Visit: Payer: Self-pay | Admitting: Cardiology

## 2024-01-23 LAB — LIPOPROTEIN A (LPA): Lipoprotein (a): <8.4 nmol/L (ref <75.0)

## 2024-01-23 LAB — SPECIMEN STATUS REPORT

## 2024-01-23 LAB — LIPID PANEL
Chol/HDL Ratio: 2.6 ratio (ref 0.0–4.4)
Cholesterol, Total: 134 mg/dL (ref 100–199)
HDL: 51 mg/dL (ref >39)
LDL Chol Calc (NIH): 64 mg/dL (ref 0–99)
Triglycerides: 104 mg/dL (ref 0–149)
VLDL Cholesterol Cal: 19 mg/dL (ref 5–40)

## 2024-01-23 LAB — HEPATIC FUNCTION PANEL
ALT: 17 IU/L (ref 0–32)
AST: 20 IU/L (ref 0–40)
Albumin: 4.5 g/dL (ref 3.9–4.9)
Alkaline Phosphatase: 89 IU/L (ref 44–121)
Bilirubin Total: 0.3 mg/dL (ref 0.0–1.2)
Bilirubin, Direct: 0.12 mg/dL (ref 0.00–0.40)
Total Protein: 7 g/dL (ref 6.0–8.5)

## 2024-01-24 ENCOUNTER — Ambulatory Visit: Payer: Self-pay | Admitting: Cardiology

## 2024-03-03 ENCOUNTER — Encounter: Payer: Self-pay | Admitting: Behavioral Health

## 2024-03-03 ENCOUNTER — Ambulatory Visit (INDEPENDENT_AMBULATORY_CARE_PROVIDER_SITE_OTHER): Admitting: Behavioral Health

## 2024-03-03 DIAGNOSIS — F411 Generalized anxiety disorder: Secondary | ICD-10-CM | POA: Diagnosis not present

## 2024-03-03 DIAGNOSIS — F4320 Adjustment disorder, unspecified: Secondary | ICD-10-CM

## 2024-03-03 DIAGNOSIS — F330 Major depressive disorder, recurrent, mild: Secondary | ICD-10-CM

## 2024-03-03 DIAGNOSIS — F4321 Adjustment disorder with depressed mood: Secondary | ICD-10-CM

## 2024-03-03 NOTE — Progress Notes (Signed)
   French Ana, [facility]

## 2024-03-03 NOTE — Progress Notes (Signed)
 [facility] Counselor Initial Adult Exam  Name: Jacqueline Silva Date: 03/03/2024 MRN: [id number] DOB: 1961/06/02 PCP: Remonia Alm [name], MD  Time spent: 9:05 AM until 10 AM, 55 minutes spent face-to-face with the patient in the outpatient therapist office.  Guardian/Payee: Self  Paperwork requested: No   Reason for Visit /Presenting Problem: Anxiety, mild depression, grief  Sergio is a 63 year old married female who presents with symptoms of anxiety, mild depression and some grief.  She currently lives with her husband Hosey she has been married to for 21 years.  She reports a very good relationship with him.  They also have 2 cats who live in the home.  She has a 82 year old daughter from her previous relationship and her daughter lives in [place] .  The patient reports that she does not think that relationship is as good as she would like for her to be but says it is okay.  She feels that the daughter may think it is better than the patient thinks it is.  She said that is one of the things that she would like to process and therapy moving forward.  Grew up in [facility] [place]  with her biological mother and father.  Father is now 20 and her mother will be 17 in December.  Reports a fairly good relationship with her parents.  Several months ago her father started having some issues where he was hallucinating and they went through multiple medical visits blood work etc. and recently they found out that he has stage IV colon.  Treatment is not available because of the staging because of his age.  She had asked the doctor separately what life expectancy was and was told about 6 months.  Her parents do not know that 6 months but that her parents are people who do not want to know those kind of things and do not want to discuss it.  She sees her father every weekend and can see the changes that are beginning to take place.  This will be the primary thing she wants to  work on and therapy has helped to deal with her father's dieting.  Hospice has come and help which her parents have excepted but again they are not willing to talk much about it.  Her mother still insist on being the primary caregiver and the patient is trying to figure out how to be helpful to her mother without feeling like she is getting in the way.  The patient's faith is important to her and says she grew up in a [employer].  Her mother's faith is supportive to her but her father went to church the patient feels primarily had a publication to her mother.  She is not sure what her father believes and would like to be able to have a conversation with him.  The patient said going up she felt like she was a disappointment to her father.  She remembers as a child asking him if she wished he had been son and he said yes but did not elaborate.  She said that her father had it.  He talked about bringing but she did not follow-up that conversation and wonders how she might be able to relate to him now.  She said he is not a very strong communicator and never has been but he was always a good provider to her.  She said she had a good relationship with her mother growing up.  The patient is an only  child.  Her daughter 2 is an only child.  In 2022 the patient said she started getting to the point where she was crying all the time and did not necessarily understand why.  Her gynecologist put her on Prozac  starting at 10 mg and going to 20 mg.  She said it worked very well and she went off of it for a while but is now back on it.  At 10 mg she said that Dr. Described her as struggling but on 20 she feels much calmer but does not cry either and she wonders if there is a balance.  I encouraged her to speak to the doctor about middle ground to see if they most be options.  She describes her depression as about a 3 scale of 10 but that is hard to judge because she does not really try.  We looked briefly up for what she  feels as opposed to how she exhibits her sadness and depression.  I reminded her that she is also grieving.  She reports her anxiety especially on the Prozac  is about a 2 on a scale of 10.  She does say at night she has a hard time shutting her brain down sometimes.  Since being on the in 2022 she says about twice a week she has a very vivid dreams.  She said none with the exception of 1 which involved a snake or scary.  She has a severe fear of snakes.  Otherwise she said at times she knew the people in the dream.  When she was off of the Prozac  she saw an increase in irritability and does not want to return to that.  The patient's faith is important to her.  She grew up in a [employer] but about 3 years ago got involved in her church in the area where she lives and said she loves it.  Is a small church where she has good supports and good friends and feels at home.  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  She says she also has always used breathing exercises and breathing in the 7 and then breathing out to a different count.  I introduced a different type of breathing exercise but she also uses breathing exercises of breathing and her favorite color breathing out her least favorite color as well as breathing when breathing out hot.  I also introduce a grounding exercise for her to try.  She reports that she feels she gets 7 to 8 hours of sleep but her smart watch only says she gets 6.  She tries to be in bed by 1030 and is usually up by 5:45 AM but does typically wake up around 2 to 3:00 in the morning for 30 minutes but then can go back to sleep fairly easily.  The patient says that for the most part she is fairly well but there has been about a 10 pound weight gain over the past year or so which she is not comfortable with.  She thinks that part of the grief/depression may be the motivation to be active.  In the past she has always worked out or stayed active and she is having a hard time doing that  now.  She says she loves homemade cookies and cheesecake and sweets in general.  Her breakfast is usually a light breakfast as well as a light lunch and then a heavier but still normal dinner.  She said she and her husband cook a lot  on Sunday to have leftovers throughout the week.  She drinks hot tea about 2 cups a day but says it does not have heavy caffeine content.  She drinks no soda or no coffee.  She reports rare alcohol use saying she might have 1 cocktail every few weeks.  She reports no tobacco use.  [place] both parents smoked and she remembers thinking early on that she would never smoke.  She reports no hallucinations or delusions and says there is some anxiety based on the extent of her father's hallucinations that she could have some but she knows otherwise there is no history of them.  She says she also thinks more about cancer than she did since her father has a but does not feel that she obsesses about that.  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  She is always loves what she does and still likes what she does but said that she is starting to see that he is getting tired of working.  She is tired of the politics and sees some changes taking place in the company.  She says there is no financial concern and her husband is 5 years younger than she is so he will continue to work.  She does think some about what she might do but has some things in mind that she would like to do.  She anticipates retiring at the end of 2026 but says her father's health may change that.  Does contract for safety having no thoughts of hurting herself or anyone else.  Mental Status Exam: Appearance:   Well Groomed     Behavior:  Appropriate  Motor:  Normal  Speech/Language:   Clear and Coherent  Affect:  Appropriate  Mood:  anxious  Thought process:  normal  Thought content:    WNL  Sensory/Perceptual  disturbances:    WNL  Orientation:  oriented to person, place, time/date, situation, day of week, month of year, and year  Attention:  Good  Concentration:  Good  Memory:  WNL  Fund of knowledge:   Good  Insight:    Good  Judgment:   Good  Impulse Control:  Good    Risk Assessment: Danger to Self:  No Self-injurious Behavior: No Danger to Others: No Duty to Warn:no Physical Aggression / Violence:No  Access to Firearms a concern: No  Gang Involvement:No  Patient / guardian was educated about steps to take if suicide or homicide risk level increases between visits: n/a While future psychiatric events cannot be accurately predicted, the patient does not currently require acute inpatient psychiatric care and does not currently meet [facility]  involuntary commitment criteria.  Substance Abuse History: Current substance abuse: No     Past Psychiatric History:   Previous psychological history is significant for depression Outpatient Providers: Dr. Cris, patient's primary care physician is located at her place of work History of Psych Hospitalization: No  Psychological Testing: n/a   Abuse History:  Victim of: No., None reported   Report needed: No. Victim of Neglect:No. Perpetrator of none reported  Witness / Exposure to Domestic Violence: None reported  Protective Services Involvement: No  Witness to [employer] Violence:  No   Family History:  Family History  Problem Relation Age of Onset   Hyperlipidemia Mother    Dementia Father     Living situation: the patient lives with their spouse  Sexual  Orientation: Straight  Relationship Status: married  Name of spouse / other: Hosey If a parent, number of children / ages: 1 adult daughter  Support Systems: spouse friends Location manager Stress:  No   Income/Employment/Disability: Employment  Financial planner: No   Educational History: Education: Chief Strategy Officer: Protestant  Any cultural differences that may affect / interfere with treatment:  not applicable   Recreation/Hobbies: reading  Stressors: Other: There is diagnosis of cancer, contemplating retirement, relationship with daughter    Strengths: Supportive Relationships, Family, Friends, Church, Spirituality, Hopefulness, Journalist, newspaper, and Able to Communicate Effectively  Barriers:     Legal History: Pending legal issue / charges: The patient has no significant history of legal issues. History of legal issue / charges: n/a  Medical History/Surgical History: reviewed Past Medical History:  Diagnosis Date   ASCUS on Pap smear 07/11/1995   Hemorrhoid    HSV-2 (herpes simplex virus 2) infection 10/09/2003   Hyperlipidemia    Osteoporosis 03/10/2012   Vitamin D  deficiency     Past Surgical History:  Procedure Laterality Date   BREAST BIOPSY  1986   ESOPHAGOGASTRODUODENOSCOPY     Dr. Kristie    Medications: Current Outpatient Medications  Medication Sig Dispense Refill   aspirin  EC 81 MG tablet Take 1 tablet (81 mg total) by mouth daily. Swallow whole. 90 tablet 3   atorvastatin  (LIPITOR) 20 MG tablet Take 1 tablet (20 mg total) by mouth daily. 90 tablet 3   denosumab  (PROLIA ) 60 MG/ML SOSY injection Inject 60 mg into the skin every 6 (six) months.     Estradiol  10 MCG TABS vaginal tablet Place 1 tablet (10 mcg total) vaginally daily. 30 tablet 12   ezetimibe  (ZETIA ) 10 MG tablet Take 1 tablet (10 mg total) by mouth daily. 90 tablet 3   FLUoxetine  (PROZAC ) 10 MG capsule Take 2 capsules (20 mg total) by mouth daily. 180 capsule 3   hydroquinone 4 % cream daily. (Patient not taking: Reported on 01/14/2024)     nitrofurantoin , macrocrystal-monohydrate, (MACROBID ) 100 MG capsule Take one tablet orally after intercourse. (Patient not taking: Reported on 01/14/2024) 30 capsule 2   tretinoin (RETIN-A) 0.05 % cream Apply 1 application. topically at bedtime.     valACYclovir  (VALTREX )  500 MG tablet Take 1 tablet (500 mg total) by mouth daily. 90 tablet 3   No current facility-administered medications for this visit.    No Known Allergies  Diagnoses:  Generalized anxiety disorder, major depressive disorder, recurrent, mild, grief  Plan of Care: I will meet with the patient every 2 weeks.  She prefers to meet in office.  Initial goals will be to help her process and deal with her father's terminal cancer diagnosis, relationship with her daughter, and job situation in which she is contemplating retirement.  A formal treatment plan will be introduced in the second session.  Tentative target date will be September 06, 2024   Lorrene CHRISTELLA Hasten, [facility]

## 2024-03-18 ENCOUNTER — Ambulatory Visit: Admitting: Behavioral Health

## 2024-03-31 ENCOUNTER — Ambulatory Visit (INDEPENDENT_AMBULATORY_CARE_PROVIDER_SITE_OTHER): Admitting: Behavioral Health

## 2024-03-31 ENCOUNTER — Encounter: Payer: Self-pay | Admitting: Behavioral Health

## 2024-03-31 DIAGNOSIS — F4321 Adjustment disorder with depressed mood: Secondary | ICD-10-CM | POA: Diagnosis not present

## 2024-03-31 DIAGNOSIS — F4320 Adjustment disorder, unspecified: Secondary | ICD-10-CM

## 2024-03-31 DIAGNOSIS — F330 Major depressive disorder, recurrent, mild: Secondary | ICD-10-CM

## 2024-03-31 DIAGNOSIS — F411 Generalized anxiety disorder: Secondary | ICD-10-CM | POA: Diagnosis not present

## 2024-03-31 NOTE — Progress Notes (Signed)
 [facility] Counselor Initial Adult Exam  Name: Jacqueline Silva Date: 03/31/2024 MRN: [id number] DOB: 01-05-1961 PCP: Remonia Alm [name], MD  Time spent: 8:02 AM until 8:50 AM, 48 minutes spent face-to-face with the patient in the outpatient therapist office.  Guardian/Payee: Self  Paperwork requested: No   Reason for Visit /Presenting Problem: Anxiety, mild depression, grief  We processed more of the patient's grief surrounding her dad having cancer.  Her parents now know that approximately has 5 to 6 months to live.  She is going down there every weekend and seeing him some during the week.  She says she is constantly telling herself that she is not doing enough.  They talk but they will talk about anything serious so she does not know how he is feeling.  I suggested that she lean on the wisdom of hospice in terms of what would be helpful medication with her father.  She says he has never been want to talk about emotional things so we talked about different ways she could start the conversation with him to see where he is feeling a give him the chance to express that.  We also talked about practicing giving herself grace and being mindful of the moment with her dad and her mom. She knows that she already does a lot of things for her mom and dad while she is there and checks on them regularly when she can't be.  I encouraged self care and staying as much to the routine of those things which are comforting to her as she can.  We looked more at mindfulness so she can be more in the moment and present.  Does contract for safety having no thoughts of hurting herself or anyone else.  Mental Status Exam: Appearance:   Well Groomed     Behavior:  Appropriate  Motor:  Normal  Speech/Language:   Clear and Coherent  Affect:  Appropriate  Mood:  anxious  Thought process:  normal  Thought content:    WNL  Sensory/Perceptual disturbances:    WNL  Orientation:  oriented to person, place,  time/date, situation, day of week, month of year, and year  Attention:  Good  Concentration:  Good  Memory:  WNL  Fund of knowledge:   Good  Insight:    Good  Judgment:   Good  Impulse Control:  Good    Risk Assessment: Danger to Self:  No Self-injurious Behavior: No Danger to Others: No Duty to Warn:no Physical Aggression / Violence:No  Access to Firearms a concern: No  Gang Involvement:No  Patient / guardian was educated about steps to take if suicide or homicide risk level increases between visits: n/a While future psychiatric events cannot be accurately predicted, the patient does not currently require acute inpatient psychiatric care and does not currently meet [facility]  involuntary commitment criteria.  Substance Abuse History: Current substance abuse: No     Past Psychiatric History:   Previous psychological history is significant for depression Outpatient Providers: Dr. Cris, patient's primary care physician is located at her place of work History of Psych Hospitalization: No  Psychological Testing: n/a   Abuse History:  Victim of: No., None reported   Report needed: No. Victim of Neglect:No. Perpetrator of none reported  Witness / Exposure to Domestic Violence: None reported  Protective Services Involvement: No  Witness to [employer] Violence:  No   Family History:  Family History  Problem Relation Age of Onset   Hyperlipidemia Mother  Dementia Father     Living situation: the patient lives with their spouse  Sexual Orientation: Straight  Relationship Status: married  Name of spouse / other: Jacqueline Silva If a parent, number of children / ages: 1 adult daughter  Support Systems: spouse friends Location manager Stress:  No   Income/Employment/Disability: Employment  Financial planner: No   Educational History: Education: Risk manager: Protestant  Any cultural differences that may affect / interfere  with treatment:  not applicable   Recreation/Hobbies: reading  Stressors: Other: There is diagnosis of cancer, contemplating retirement, relationship with daughter    Strengths: Supportive Relationships, Family, Friends, Church, Spirituality, Hopefulness, Journalist, newspaper, and Able to Communicate Effectively  Barriers:     Legal History: Pending legal issue / charges: The patient has no significant history of legal issues. History of legal issue / charges: n/a  Medical History/Surgical History: reviewed Past Medical History:  Diagnosis Date   ASCUS on Pap smear 07/11/1995   Hemorrhoid    HSV-2 (herpes simplex virus 2) infection 10/09/2003   Hyperlipidemia    Osteoporosis 03/10/2012   Vitamin D  deficiency     Past Surgical History:  Procedure Laterality Date   BREAST BIOPSY  1986   ESOPHAGOGASTRODUODENOSCOPY     Dr. Kristie    Medications: Current Outpatient Medications  Medication Sig Dispense Refill   aspirin  EC 81 MG tablet Take 1 tablet (81 mg total) by mouth daily. Swallow whole. 90 tablet 3   atorvastatin  (LIPITOR) 20 MG tablet Take 1 tablet (20 mg total) by mouth daily. 90 tablet 3   denosumab  (PROLIA ) 60 MG/ML SOSY injection Inject 60 mg into the skin every 6 (six) months.     Estradiol  10 MCG TABS vaginal tablet Place 1 tablet (10 mcg total) vaginally daily. 30 tablet 12   ezetimibe  (ZETIA ) 10 MG tablet Take 1 tablet (10 mg total) by mouth daily. 90 tablet 3   FLUoxetine  (PROZAC ) 10 MG capsule Take 2 capsules (20 mg total) by mouth daily. 180 capsule 3   hydroquinone 4 % cream daily. (Patient not taking: Reported on 01/14/2024)     nitrofurantoin , macrocrystal-monohydrate, (MACROBID ) 100 MG capsule Take one tablet orally after intercourse. (Patient not taking: Reported on 01/14/2024) 30 capsule 2   tretinoin (RETIN-A) 0.05 % cream Apply 1 application. topically at bedtime.     valACYclovir  (VALTREX ) 500 MG tablet Take 1 tablet (500 mg total) by mouth daily. 90 tablet 3    No current facility-administered medications for this visit.    No Known Allergies  Diagnoses:  Generalized anxiety disorder, major depressive disorder, recurrent, mild, grief  Plan of Care: I will meet with the patient every 2 weeks.  She prefers to meet in office.  Initial goals will be to help her process and deal with her father's terminal cancer diagnosis, relationship with her daughter, and job situation in which she is contemplating retirement.  A formal treatment plan will be introduced in the second session.  Tentative target date will be September 06, 2024   Lorrene CHRISTELLA Hasten, [facility]

## 2024-04-14 ENCOUNTER — Encounter: Payer: Self-pay | Admitting: Behavioral Health

## 2024-04-14 ENCOUNTER — Ambulatory Visit: Admitting: Behavioral Health

## 2024-04-14 DIAGNOSIS — F330 Major depressive disorder, recurrent, mild: Secondary | ICD-10-CM

## 2024-04-14 DIAGNOSIS — F4321 Adjustment disorder with depressed mood: Secondary | ICD-10-CM | POA: Diagnosis not present

## 2024-04-14 DIAGNOSIS — F411 Generalized anxiety disorder: Secondary | ICD-10-CM

## 2024-04-14 DIAGNOSIS — F4320 Adjustment disorder, unspecified: Secondary | ICD-10-CM

## 2024-04-14 NOTE — Progress Notes (Signed)
 [facility] Counselor Initial Adult Exam  Name: Jacqueline Silva Date: 04/14/2024 MRN: [id number] DOB: 19-Mar-1961 PCP: Jacqueline Alm [name], MD  Time spent: 8:07 AM until 8:55 AM, 48 minutes spent face-to-face with the patient in the outpatient therapist office.  Guardian/Payee: Self  Paperwork requested: No   Reason for Visit /Presenting Problem: Anxiety, mild depression, grief The patient says that she can see her dad's condition changing and hospice has verified that.  She now knows that her mother and father are aware of the timeline in terms of the progression of his illness.  She reports that both seem to be improving good spirits but she is concerned about her mom as her mom feels the need to do everything.  We talked about that being a part of mom's grieving process but ways that the patient can continue to be of benefit to both her mom and her dad.  We also stressed the importance of self-care.  She has been using grounding exercises as well as the mindfulness practice and says that she sees significant benefit from that.  She is noticing that her projective type thoughts more easily and is doing a better job of keeping her thoughts in the moment.  Her daughter was also able to come down for the weekend visit and said that she and her parents enjoyed the visit.  Because her daughter lives in [place]  and she does not get to come very often.  The patient is leaning more into considering retirement.  She thinks within a year we started talking about what that might look like for her in terms of planning versus projecting financially, exploring interest, travel.  Also introduced the concept of the 5 level language is describing each of them to her looking at what her story including words of affirmation and acts of service and what she sees as love languages of her husband and her parents her daughter.  She is going to buy the book and read it so we can talk about it more in  following sessions. Does contract for safety having no thoughts of hurting herself or anyone else.  Mental Status Exam: Appearance:   Well Groomed     Behavior:  Appropriate  Motor:  Normal  Speech/Language:   Clear and Coherent  Affect:  Appropriate  Mood:  anxious  Thought process:  normal  Thought content:    WNL  Sensory/Perceptual disturbances:    WNL  Orientation:  oriented to person, place, time/date, situation, day of week, month of year, and year  Attention:  Good  Concentration:  Good  Memory:  WNL  Fund of knowledge:   Good  Insight:    Good  Judgment:   Good  Impulse Control:  Good    Risk Assessment: Danger to Self:  No Self-injurious Behavior: No Danger to Others: No Duty to Warn:no Physical Aggression / Violence:No  Access to Firearms a concern: No  Gang Involvement:No  Patient / guardian was educated about steps to take if suicide or homicide risk level increases between visits: n/a While future psychiatric events cannot be accurately predicted, the patient does not currently require acute inpatient psychiatric care and does not currently meet [place]  involuntary commitment criteria.  Substance Abuse History: Current substance abuse: No     Past Psychiatric History:   Previous psychological history is significant for depression Outpatient Providers: Jacqueline Silva, patient's primary care physician is located at her place of work History of Psych Hospitalization: No  Psychological  Testing: n/a   Abuse History:  Victim of: No., None reported   Report needed: No. Victim of Neglect:No. Perpetrator of none reported  Witness / Exposure to Domestic Violence: None reported  Protective Services Involvement: No  Witness to [employer] Violence:  No   Family History:  Family History  Problem Relation Age of Onset   Hyperlipidemia Mother    Dementia Father     Living situation: the patient lives with their spouse  Sexual Orientation:  Straight  Relationship Status: married  Name of spouse / other: Jacqueline Silva If a parent, number of children / ages: 1 adult daughter  Support Systems: spouse friends Location manager Stress:  No   Income/Employment/Disability: Employment  Financial planner: No   Educational History: Education: Risk manager: Protestant  Any cultural differences that may affect / interfere with treatment:  not applicable   Recreation/Hobbies: reading  Stressors: Other: There is diagnosis of cancer, contemplating retirement, relationship with daughter    Strengths: Supportive Relationships, Family, Friends, Church, Spirituality, Hopefulness, Journalist, newspaper, and Able to Communicate Effectively  Barriers:     Legal History: Pending legal issue / charges: The patient has no significant history of legal issues. History of legal issue / charges: n/a  Medical History/Surgical History: reviewed Past Medical History:  Diagnosis Date   ASCUS on Pap smear 07/11/1995   Hemorrhoid    HSV-2 (herpes simplex virus 2) infection 10/09/2003   Hyperlipidemia    Osteoporosis 03/10/2012   Vitamin D  deficiency     Past Surgical History:  Procedure Laterality Date   BREAST BIOPSY  1986   ESOPHAGOGASTRODUODENOSCOPY     Dr. Kristie    Medications: Current Outpatient Medications  Medication Sig Dispense Refill   aspirin  EC 81 MG tablet Take 1 tablet (81 mg total) by mouth daily. Swallow whole. 90 tablet 3   atorvastatin  (LIPITOR) 20 MG tablet Take 1 tablet (20 mg total) by mouth daily. 90 tablet 3   denosumab  (PROLIA ) 60 MG/ML SOSY injection Inject 60 mg into the skin every 6 (six) months.     Estradiol  10 MCG TABS vaginal tablet Place 1 tablet (10 mcg total) vaginally daily. 30 tablet 12   ezetimibe  (ZETIA ) 10 MG tablet Take 1 tablet (10 mg total) by mouth daily. 90 tablet 3   FLUoxetine  (PROZAC ) 10 MG capsule Take 2 capsules (20 mg total) by mouth daily. 180 capsule 3    hydroquinone 4 % cream daily. (Patient not taking: Reported on 01/14/2024)     nitrofurantoin , macrocrystal-monohydrate, (MACROBID ) 100 MG capsule Take one tablet orally after intercourse. (Patient not taking: Reported on 01/14/2024) 30 capsule 2   tretinoin (RETIN-A) 0.05 % cream Apply 1 application. topically at bedtime.     valACYclovir  (VALTREX ) 500 MG tablet Take 1 tablet (500 mg total) by mouth daily. 90 tablet 3   No current facility-administered medications for this visit.    No Known Allergies  Diagnoses:  Generalized anxiety disorder, major depressive disorder, recurrent, mild, grief  Plan of Care: I will meet with the patient every 2 weeks.  She prefers to meet in office.  Initial goals will be to help her process and deal with her father's terminal cancer diagnosis, relationship with her daughter, and job situation in which she is contemplating retirement.  A formal treatment plan will be introduced in the second session.  Tentative target date will be September 06, 2024   Lorrene CHRISTELLA Hasten, [facility]  Lorrene CHRISTELLA Hasten, [facility]

## 2024-05-06 ENCOUNTER — Ambulatory Visit: Admitting: Behavioral Health

## 2024-05-06 ENCOUNTER — Encounter: Payer: Self-pay | Admitting: Behavioral Health

## 2024-05-06 DIAGNOSIS — F4320 Adjustment disorder, unspecified: Secondary | ICD-10-CM

## 2024-05-06 DIAGNOSIS — F330 Major depressive disorder, recurrent, mild: Secondary | ICD-10-CM

## 2024-05-06 DIAGNOSIS — F4321 Adjustment disorder with depressed mood: Secondary | ICD-10-CM

## 2024-05-06 DIAGNOSIS — F411 Generalized anxiety disorder: Secondary | ICD-10-CM | POA: Diagnosis not present

## 2024-05-06 NOTE — Progress Notes (Signed)
 [facility] Counselor Initial Adult Exam  Name: Jacqueline Silva Date: 05/06/2024 MRN: [id number] DOB: [birth year] PCP: Remonia Alm [name], MD  Time spent: 11 AM until 11:43 AM, 43 minutes spent face-to-face with the patient in the outpatient therapist office.  Guardian/Payee: Self  Paperwork requested: No   Reason for Visit /Presenting Problem: Anxiety, mild depression, grief The patient did buy the 5 love languages book and has read the introduction.  She would like to talk about that in future sessions but says she is only starting to recognize love languages within her family.  She says that she watches her mother messages now that her mother's love language is acts of service.  She continues to go to her parents every weekend and sometimes during the week to doctors visits with her parents that she can.  She can see physically that her dad is changing as he is eating less and has some gastrointestinal issues.  She says she basically only gets up to go to the bathroom and otherwise is in his chair or bed most of the time.  Patient says she has struggled with remembering time as from her childhood.  Ask if there was anything that might have interfered with that and she said there was something but she did not want to talk about that.  She remembers that her dad did things with her but could not remember specifically some of the things that he did.  We talked about that being possible by product of all that she has going on email.  I encouraged her to ask him what some of his memories were with her to stop the conversation help her.  She says that she has a niece that comes in and talks to her dad and she sees how effective she is with her dad and getting him to talk about their past.  She also has learned that she can offered to do things with her mom but if she does not want her to that she steps away and does not feel bad because she knows that she offered.  She continues to look for  things to do to help her mom.:  The patient says she loves Christmas and usually puts for artificial and 1 real tree.  She says that she does not feel she has enough time to do that this year but I encouraged her to think about putting up her we will treat to create much normalcy during the holidays that she can but also as a way to have something to feel good about and go home to.  Does contract for safety having no thoughts of hurting herself or anyone else.  Mental Status Exam: Appearance:   Well Groomed     Behavior:  Appropriate  Motor:  Normal  Speech/Language:   Clear and Coherent  Affect:  Appropriate  Mood:  anxious  Thought process:  normal  Thought content:    WNL  Sensory/Perceptual disturbances:    WNL  Orientation:  oriented to person, place, time/date, situation, day of week, month of year, and year  Attention:  Good  Concentration:  Good  Memory:  WNL  Fund of knowledge:   Good  Insight:    Good  Judgment:   Good  Impulse Control:  Good    Risk Assessment: Danger to Self:  No Self-injurious Behavior: No Danger to Others: No Duty to Warn:no Physical Aggression / Violence:No  Access to Firearms a concern: No  Gang Involvement:No  Patient / guardian was educated about steps to take if suicide or homicide risk level increases between visits: n/a While future psychiatric events cannot be accurately predicted, the patient does not currently require acute inpatient psychiatric care and does not currently meet [place]  involuntary commitment criteria.  Substance Abuse History: Current substance abuse: No     Past Psychiatric History:   Previous psychological history is significant for depression Outpatient Providers: Dr. Cris, patient's primary care physician is located at her place of work History of Psych Hospitalization: No  Psychological Testing: n/a   Abuse History:  Victim of: No., None reported   Report needed: No. Victim of  Neglect:No. Perpetrator of none reported  Witness / Exposure to Domestic Violence: None reported  Protective Services Involvement: No  Witness to [employer] Violence:  No   Family History:  Family History  Problem Relation Age of Onset   Hyperlipidemia Mother    Dementia Father     Living situation: the patient lives with their spouse  Sexual Orientation: Straight  Relationship Status: married  Name of spouse / other: Hosey If a parent, number of children / ages: 1 adult daughter  Support Systems: spouse friends Location Manager Stress:  No   Income/Employment/Disability: Employment  Financial Planner: No   Educational History: Education: Risk Manager: Protestant  Any cultural differences that may affect / interfere with treatment:  not applicable   Recreation/Hobbies: reading  Stressors: Other: There is diagnosis of cancer, contemplating retirement, relationship with daughter    Strengths: Supportive Relationships, Family, Friends, Church, Spirituality, Hopefulness, Journalist, Newspaper, and Able to Communicate Effectively  Barriers:     Legal History: Pending legal issue / charges: The patient has no significant history of legal issues. History of legal issue / charges: n/a  Medical History/Surgical History: reviewed Past Medical History:  Diagnosis Date   ASCUS on Pap smear 07/11/1995   Hemorrhoid    HSV-2 (herpes simplex virus 2) infection 10/09/2003   Hyperlipidemia    Osteoporosis 03/10/2012   Vitamin D  deficiency     Past Surgical History:  Procedure Laterality Date   BREAST BIOPSY  1986   ESOPHAGOGASTRODUODENOSCOPY     Dr. Kristie    Medications: Current Outpatient Medications  Medication Sig Dispense Refill   aspirin  EC 81 MG tablet Take 1 tablet (81 mg total) by mouth daily. Swallow whole. 90 tablet 3   atorvastatin  (LIPITOR) 20 MG tablet Take 1 tablet (20 mg total) by mouth daily. 90 tablet 3   denosumab   (PROLIA ) 60 MG/ML SOSY injection Inject 60 mg into the skin every 6 (six) months.     Estradiol  10 MCG TABS vaginal tablet Place 1 tablet (10 mcg total) vaginally daily. 30 tablet 12   ezetimibe  (ZETIA ) 10 MG tablet Take 1 tablet (10 mg total) by mouth daily. 90 tablet 3   FLUoxetine  (PROZAC ) 10 MG capsule Take 2 capsules (20 mg total) by mouth daily. 180 capsule 3   hydroquinone 4 % cream daily. (Patient not taking: Reported on 01/14/2024)     nitrofurantoin , macrocrystal-monohydrate, (MACROBID ) 100 MG capsule Take one tablet orally after intercourse. (Patient not taking: Reported on 01/14/2024) 30 capsule 2   tretinoin (RETIN-A) 0.05 % cream Apply 1 application. topically at bedtime.     valACYclovir  (VALTREX ) 500 MG tablet Take 1 tablet (500 mg total) by mouth daily. 90 tablet 3   No current facility-administered medications for this visit.    No Known Allergies  Diagnoses:  Generalized anxiety disorder,  major depressive disorder, recurrent, mild, grief  Plan of Care: I will meet with the patient every 2 weeks.  She prefers to meet in office.  Initial goals will be to help her process and deal with her father's terminal cancer diagnosis, relationship with her daughter, and job situation in which she is contemplating retirement.  A formal treatment plan will be introduced in the second session.  Tentative target date will be September 06, 2024   Lorrene CHRISTELLA Hasten, [facility]                              Lorrene CHRISTELLA Hasten, [facility]               Lorrene CHRISTELLA Hasten, [facility]

## 2024-05-13 ENCOUNTER — Other Ambulatory Visit: Payer: Self-pay | Admitting: Family Medicine

## 2024-05-13 DIAGNOSIS — G8929 Other chronic pain: Secondary | ICD-10-CM

## 2024-05-13 NOTE — Progress Notes (Signed)
 Acute on chronic L shoulder pain.  PT referral

## 2024-05-29 ENCOUNTER — Encounter: Payer: Self-pay | Admitting: Physical Therapy

## 2024-05-29 ENCOUNTER — Ambulatory Visit: Admitting: Physical Therapy

## 2024-05-29 DIAGNOSIS — M25512 Pain in left shoulder: Secondary | ICD-10-CM

## 2024-05-29 DIAGNOSIS — M25612 Stiffness of left shoulder, not elsewhere classified: Secondary | ICD-10-CM | POA: Diagnosis not present

## 2024-05-29 DIAGNOSIS — G8929 Other chronic pain: Secondary | ICD-10-CM | POA: Diagnosis not present

## 2024-05-29 NOTE — Therapy (Signed)
 OUTPATIENT PHYSICAL THERAPY UPPER EXTREMITY EVALUATION   Patient Name: Jacqueline Silva MRN: [id number] DOB:[birth year], 63 y.o., female Today's Date: 05/29/2024  END OF SESSION:  PT End of Session - 05/29/24 0929     Visit Number 1    Number of Visits 16    Date for Recertification  08/21/24    Authorization Type UHC - not medicare    PT Start Time 0928    PT Stop Time 1007    PT Time Calculation (min) 39 min    Activity Tolerance Patient tolerated treatment well    Behavior During Therapy [facility] for tasks assessed/performed          Past Medical History:  Diagnosis Date   ASCUS on Pap smear 07/11/1995   Hemorrhoid    HSV-2 (herpes simplex virus 2) infection 10/09/2003   Hyperlipidemia    Osteoporosis 03/10/2012   Vitamin D  deficiency    Past Surgical History:  Procedure Laterality Date   BREAST BIOPSY  1986   ESOPHAGOGASTRODUODENOSCOPY     Dr. Kristie   Patient Active Problem List   Diagnosis Date Noted   Vaginismus 12/25/2022   Colon cancer screening 12/13/2021   Gastroesophageal reflux disease 11/24/2021   Osteoporosis 07/08/2014    PCP: Alm Heads   REFERRING PROVIDER: Alm Heads   REFERRING DIAG: L shoulder pain   THERAPY DIAG:  Chronic left shoulder pain  Stiffness of shoulder joint, left  Rationale for Evaluation and Treatment: Rehabilitation  ONSET DATE:   SUBJECTIVE:                                                                                                                                                                                      SUBJECTIVE STATEMENT: Working at [employer], computer work.  Pt states Early summer she increased pain in L shoulder. Now continues to feel stiff and sore.  Previous pain in past years- PT was helpful.  Exercise: walks daily 30 min.    Hand dominance: Right  PERTINENT HISTORY: Osteoporosis,    PAIN:  Are you having pain? Yes: NPRS scale: up to 7/10  at night , better during the day.  Pain  location: L shoulder  Pain description: [place] Aggravating factors: Reaching up, behind, taking off shirt. Pain at night trying to sleep.  Relieving factors: none stated   PRECAUTIONS: None  RED FLAGS: None   WEIGHT BEARING RESTRICTIONS: No  FALLS:  Has patient fallen in last 6 months? No   PLOF: Independent  PATIENT GOALS: Decreased pain  NEXT MD VISIT:   OBJECTIVE:   DIAGNOSTIC FINDINGS:    PATIENT SURVEYS :  31.8/100  31/8 %   Quick  Dash - Eval    COGNITION: Overall cognitive status: Within functional limits for tasks assessed     SENSATION: WFL  POSTURE:   UPPER EXTREMITY ROM:   Active  ROM Right eval Left eval  Shoulder flexion A: 160 / P: 175 A:130 / P: 150  Shoulder extension    Shoulder abduction wfl A:110/ P: 135  Shoulder adduction    Shoulder internal rotation  Mild/mod limitation for behind the back.   Shoulder external rotation  60   Elbow flexion    Elbow extension    Wrist flexion    Wrist extension    Wrist ulnar deviation    Wrist radial deviation    Wrist pronation    Wrist supination    (Blank rows = not tested)   UPPER EXTREMITY MMT:  MMT Right eval Left eval  Shoulder flexion 5 4  Shoulder extension    Shoulder abduction 5 4  Shoulder adduction    Shoulder internal rotation 5 4+  Shoulder external rotation 5 4  Middle trapezius    Lower trapezius    Elbow flexion    Elbow extension    Wrist flexion    Wrist extension    Wrist ulnar deviation    Wrist radial deviation    Wrist pronation    Wrist supination    Grip strength (lbs)    (Blank rows = not tested)  SHOULDER SPECIAL TESTS:   JOINT MOBILITY TESTING:  Hypomobile L GHJ  PALPATION:  Hypomobile L GHJ.     TODAY'S TREATMENT:                                                                                                                                         DATE:  Eval: ther ex: See below for HEP Prom L shoulder- all motions. LAD.   PATIENT  EDUCATION:  Education details: PT POC, Exam findings, HEP Person educated: Patient Education method: Explanation, Demonstration, Tactile cues, Verbal cues, and Handouts Education comprehension: verbalized understanding, returned demonstration, verbal cues required, tactile cues required, and needs further education   HOME EXERCISE PROGRAM: Access Code: [email or web address] URL: [email or web address] Date: 05/29/2024 Prepared by: Tinnie Don  Exercises - Supine Shoulder Flexion Extension AAROM with Dowel  - 1- 2 x daily - 5-7 x weekly - 1 sets - 10 reps - 3 hold - Supine Shoulder External Rotation in 45 Degrees Abduction AAROM with Dowel  - 1 x daily - 5-7 x weekly - 1 sets - 10 reps - 3 hold - Supine Chest Stretch with Elbows Bent  - 1-2 x daily - 5-7 x weekly - 1 sets - 10 reps - 3 hold - Standing Shoulder Internal Rotation Stretch with Hands Behind Back  - 1-2 x daily - 1 sets - 5-10 reps - 5 hold  ASSESSMENT:  CLINICAL IMPRESSION: Eval:  Patient presents with primary complaint of pain and rom limitations  in L shoulder. She has GHJ stiffness and limited ROM for arom and prom. She has decreased strength in L shoulder, and decreased ability for full functional activities, reaching, lifting, carrying, and IADLs. She has had good results with PT in the past. Pt to benefit from skilled PT to improve deficits and pain.    OBJECTIVE IMPAIRMENTS: decreased activity tolerance, decreased ROM, decreased strength, impaired UE functional use, and pain.   ACTIVITY LIMITATIONS: carrying, lifting, dressing, reach over head, hygiene/grooming, and locomotion level  PARTICIPATION LIMITATIONS: meal prep, cleaning, driving, shopping, and community activity  PERSONAL FACTORS: Past/current experiences and Time since onset of injury/illness/exacerbation are also affecting patient's functional outcome.   REHAB POTENTIAL: Good  CLINICAL DECISION MAKING: Stable/uncomplicated  EVALUATION COMPLEXITY:  Low  GOALS: Goals reviewed with patient? Yes   SHORT TERM GOALS: Target date: 06/19/2024   Pt to be intendment with initial HEP  Goal status: INITIAL  2.  Pt to demo improved AROM for flexion by at least 10 degrees   Goal status: INITIAL    LONG TERM GOALS: Target date: 08/21/2024   Pt to be independent with final HEP  Goal status: INITIAL  2.  Pt to demo improved AROM to be [facility] and pain free, to improve ability for ADLs.   Goal status: INITIAL  3.  Pt to demo improved strength to be at least 4+/5, to improve ability for reach, lift, carry, and IADLS   Goal status: INITIAL  4.  Pt to demo improved score on QuickDASH by at least 10 %.   Goal status: INITIAL    PLAN: PT FREQUENCY: 1-2x/week  PT DURATION: 12 weeks  PLANNED INTERVENTIONS: Therapeutic exercises, Therapeutic activity, Neuromuscular re-education, Patient/Family education, Self Care, Joint mobilization, Joint manipulation, Stair training, DME instructions, Aquatic Therapy, Dry Needling, Electrical stimulation, Cryotherapy, Moist heat, Taping, Ultrasound, Ionotophoresis 4mg /ml Dexamethasone, Manual therapy,  Vasopneumatic device, Traction, Spinal manipulation, Spinal mobilization,    PLAN FOR NEXT SESSION:   Tinnie Don, PT, DPT 10:11 AM  05/29/24

## 2024-06-17 ENCOUNTER — Encounter: Admitting: Physical Therapy

## 2024-06-18 ENCOUNTER — Encounter: Payer: Self-pay | Admitting: Physical Therapy

## 2024-06-18 ENCOUNTER — Ambulatory Visit: Admitting: Physical Therapy

## 2024-06-18 DIAGNOSIS — M62838 Other muscle spasm: Secondary | ICD-10-CM | POA: Diagnosis not present

## 2024-06-18 DIAGNOSIS — M25612 Stiffness of left shoulder, not elsewhere classified: Secondary | ICD-10-CM

## 2024-06-18 DIAGNOSIS — G8929 Other chronic pain: Secondary | ICD-10-CM

## 2024-06-18 DIAGNOSIS — M25512 Pain in left shoulder: Secondary | ICD-10-CM

## 2024-06-18 NOTE — Therapy (Signed)
 OUTPATIENT PHYSICAL THERAPY UPPER EXTREMITY TREATMENT    Patient Name: Jacqueline Silva MRN: [id number] DOB:[birth year], 63 y.o., female Today's Date: 06/18/2024  END OF SESSION:  PT End of Session - 06/18/24 0852     Visit Number 2    Number of Visits 16    Date for Recertification  08/21/24    Authorization Type UHC - not medicare    PT Start Time 0805    PT Stop Time 0843    PT Time Calculation (min) 38 min    Activity Tolerance Patient tolerated treatment well    Behavior During Therapy [facility] for tasks assessed/performed           Past Medical History:  Diagnosis Date   ASCUS on Pap smear 07/11/1995   Hemorrhoid    HSV-2 (herpes simplex virus 2) infection 10/09/2003   Hyperlipidemia    Osteoporosis 03/10/2012   Vitamin D  deficiency    Past Surgical History:  Procedure Laterality Date   BREAST BIOPSY  1986   ESOPHAGOGASTRODUODENOSCOPY     Dr. Kristie   Patient Active Problem List   Diagnosis Date Noted   Vaginismus 12/25/2022   Colon cancer screening 12/13/2021   Gastroesophageal reflux disease 11/24/2021   Osteoporosis 07/08/2014    PCP: Alm Heads   REFERRING PROVIDER: Alm Heads   REFERRING DIAG: L shoulder pain   THERAPY DIAG:  Chronic left shoulder pain  Stiffness of shoulder joint, left  Other muscle spasm  Rationale for Evaluation and Treatment: Rehabilitation  ONSET DATE:   SUBJECTIVE:                                                                                                                                                                                      SUBJECTIVE STATEMENT: Pt states shoulder feeling more sore in the last couple weeks. Has been trying to do HEP.   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 Now continues to feel stiff and sore.  Previous pain in past years- PT was helpful.  Exercise: walks daily 30 min.    Hand dominance: Right  PERTINENT  HISTORY: Osteoporosis,    PAIN:  Are you having pain? Yes: NPRS scale: up to 7/10  at night , better during the day.  Pain location: L shoulder  Pain description: Achey Aggravating factors: Reaching up, behind, taking off shirt. Pain at night trying to sleep.  Relieving factors: none stated   PRECAUTIONS: None  RED FLAGS: None   WEIGHT BEARING RESTRICTIONS: No  FALLS:  Has patient fallen in last 6 months? No   PLOF: Independent  PATIENT GOALS: Decreased  pain  NEXT MD VISIT:   OBJECTIVE:   DIAGNOSTIC FINDINGS:    PATIENT SURVEYS :  31.8/100  31/8 %   Quick Dash - Eval    COGNITION: Overall cognitive status: Within functional limits for tasks assessed     SENSATION: WFL  POSTURE:   UPPER EXTREMITY ROM:   Active  ROM Right eval Left eval  Shoulder flexion A: 160 / P: 175 A:130 / P: 150  Shoulder extension    Shoulder abduction wfl A:110/ P: 135  Shoulder adduction    Shoulder internal rotation  Mild/mod limitation for behind the back.   Shoulder external rotation  60   Elbow flexion    Elbow extension    Wrist flexion    Wrist extension    Wrist ulnar deviation    Wrist radial deviation    Wrist pronation    Wrist supination    (Blank rows = not tested)   UPPER EXTREMITY MMT:  MMT Right eval Left eval  Shoulder flexion 5 4  Shoulder extension    Shoulder abduction 5 4  Shoulder adduction    Shoulder internal rotation 5 4+  Shoulder external rotation 5 4  Middle trapezius    Lower trapezius    Elbow flexion    Elbow extension    Wrist flexion    Wrist extension    Wrist ulnar deviation    Wrist radial deviation    Wrist pronation    Wrist supination    Grip strength (lbs)    (Blank rows = not tested)  SHOULDER SPECIAL TESTS:   JOINT MOBILITY TESTING:  Hypomobile L GHJ  PALPATION:  Hypomobile L GHJ.     TODAY'S TREATMENT:                                                                                                                                          DATE:    06/18/24 Therapeutic Exercise: prom L shoulder all motions;  Aerobic: Supine: shoulder ER butterfly x 15;  Seated: pulleys x 20/flexion Standing: wall slides 1 UE x 20; scap squeeze  x 15;  Ext with stick x 12,  Behind the back IR with stick x 12 Rows GTB 2 x 10;  Stretches:  Neuromuscular Re-education: Manual Therapy: L shoulder, LAD, and GHJ joint mobs post and inf.  Therapeutic Activity: Self Care:    Eval: ther ex: See below for HEP Prom L shoulder- all motions. LAD.   PATIENT EDUCATION:  Education details: updated and reviewed HEP Person educated: Patient Education method: Explanation, Demonstration, Tactile cues, Verbal cues, and Handouts Education comprehension: verbalized understanding, returned demonstration, verbal cues required, tactile cues required, and needs further education   HOME EXERCISE PROGRAM: Access Code: [email or web address] URL: [email or web address] Date: 05/29/2024 Prepared by: Tinnie Don  Exercises - Supine Shoulder Flexion Extension AAROM with Dowel  - 1- 2 x daily - 5-7 x weekly -  1 sets - 10 reps - 3 hold - Supine Shoulder External Rotation in 45 Degrees Abduction AAROM with Dowel  - 1 x daily - 5-7 x weekly - 1 sets - 10 reps - 3 hold - Supine Chest Stretch with Elbows Bent  - 1-2 x daily - 5-7 x weekly - 1 sets - 10 reps - 3 hold - Standing Shoulder Internal Rotation Stretch with Hands Behind Back  - 1-2 x daily - 1 sets - 5-10 reps - 5 hold  ASSESSMENT:  CLINICAL IMPRESSION: Pt with good tolerance for manual and ther ex. Focus on improving PROM and AROM, will continue to focus on this in upcoming sessions.   Eval:  Patient presents with primary complaint of pain and rom limitations in L shoulder. She has GHJ stiffness and limited ROM for arom and prom. She has decreased strength in L shoulder, and decreased ability for full functional activities, reaching, lifting, carrying, and IADLs.  She has had good results with PT in the past. Pt to benefit from skilled PT to improve deficits and pain.    OBJECTIVE IMPAIRMENTS: decreased activity tolerance, decreased ROM, decreased strength, impaired UE functional use, and pain.   ACTIVITY LIMITATIONS: carrying, lifting, dressing, reach over head, hygiene/grooming, and locomotion level  PARTICIPATION LIMITATIONS: meal prep, cleaning, driving, shopping, and community activity  PERSONAL FACTORS: Past/current experiences and Time since onset of injury/illness/exacerbation are also affecting patient's functional outcome.   REHAB POTENTIAL: Good  CLINICAL DECISION MAKING: Stable/uncomplicated  EVALUATION COMPLEXITY: Low  GOALS: Goals reviewed with patient? Yes   SHORT TERM GOALS: Target date: 06/19/2024   Pt to be intendment with initial HEP  Goal status: INITIAL  2.  Pt to demo improved AROM for flexion by at least 10 degrees   Goal status: INITIAL    LONG TERM GOALS: Target date: 08/21/2024   Pt to be independent with final HEP  Goal status: INITIAL  2.  Pt to demo improved AROM to be [facility] and pain free, to improve ability for ADLs.   Goal status: INITIAL  3.  Pt to demo improved strength to be at least 4+/5, to improve ability for reach, lift, carry, and IADLS   Goal status: INITIAL  4.  Pt to demo improved score on QuickDASH by at least 10 %.   Goal status: INITIAL    PLAN: PT FREQUENCY: 1-2x/week  PT DURATION: 12 weeks  PLANNED INTERVENTIONS: Therapeutic exercises, Therapeutic activity, Neuromuscular re-education, Patient/Family education, Self Care, Joint mobilization, Joint manipulation, Stair training, DME instructions, Aquatic Therapy, Dry Needling, Electrical stimulation, Cryotherapy, Moist heat, Taping, Ultrasound, Ionotophoresis 4mg /ml Dexamethasone, Manual therapy,  Vasopneumatic device, Traction, Spinal manipulation, Spinal mobilization,    PLAN FOR NEXT SESSION:   Tinnie Don, PT,  DPT 8:52 AM  06/18/24

## 2024-06-19 ENCOUNTER — Encounter: Payer: Self-pay | Admitting: Physical Therapy

## 2024-06-19 ENCOUNTER — Ambulatory Visit: Admitting: Physical Therapy

## 2024-06-19 DIAGNOSIS — M25512 Pain in left shoulder: Secondary | ICD-10-CM | POA: Diagnosis not present

## 2024-06-19 DIAGNOSIS — G8929 Other chronic pain: Secondary | ICD-10-CM

## 2024-06-19 NOTE — Therapy (Signed)
 OUTPATIENT PHYSICAL THERAPY UPPER EXTREMITY TREATMENT    Patient Name: Jacqueline Silva MRN: [id number] DOB:1960-10-24, 63 y.o., female Today's Date: 06/19/2024  END OF SESSION:  PT End of Session - 06/19/24 0936     Visit Number 3    Number of Visits 16    Date for Recertification  08/21/24    Authorization Type UHC - not medicare    PT Start Time 0930    PT Stop Time 1010    PT Time Calculation (min) 40 min    Activity Tolerance Patient tolerated treatment well    Behavior During Therapy [facility] for tasks assessed/performed            Past Medical History:  Diagnosis Date   ASCUS on Pap smear 07/11/1995   Hemorrhoid    HSV-2 (herpes simplex virus 2) infection 10/09/2003   Hyperlipidemia    Osteoporosis 03/10/2012   Vitamin D  deficiency    Past Surgical History:  Procedure Laterality Date   BREAST BIOPSY  1986   ESOPHAGOGASTRODUODENOSCOPY     Dr. Kristie   Patient Active Problem List   Diagnosis Date Noted   Vaginismus 12/25/2022   Colon cancer screening 12/13/2021   Gastroesophageal reflux disease 11/24/2021   Osteoporosis 07/08/2014    PCP: Alm Heads   REFERRING PROVIDER: Alm Heads   REFERRING DIAG: L shoulder pain   THERAPY DIAG:  Chronic left shoulder pain  Rationale for Evaluation and Treatment: Rehabilitation  ONSET DATE:   SUBJECTIVE:                                                                                                                                                                                      SUBJECTIVE STATEMENT: Pt states doing ok today   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  Pt states Early summer she increased pain in L shoulder. Now continues to feel stiff and sore.  Previous pain in past years- PT was helpful.  Exercise: walks daily 30 min.    Hand dominance: Right  PERTINENT HISTORY: Osteoporosis,    PAIN:  Are you having pain? Yes: NPRS scale: up to 7/10  at night , better during the day.  Pain  location: L shoulder  Pain description: Achey Aggravating factors: Reaching up, behind, taking off shirt. Pain at night trying to sleep.  Relieving factors: none stated   PRECAUTIONS: None  RED FLAGS: None   WEIGHT BEARING RESTRICTIONS: No  FALLS:  Has patient fallen in last 6 months? No   PLOF: Independent  PATIENT GOALS: Decreased pain  NEXT MD VISIT:   OBJECTIVE:   DIAGNOSTIC FINDINGS:    PATIENT SURVEYS :  31.8/100  31/8 %   Quick Dash - Eval    COGNITION: Overall cognitive status: Within functional limits for tasks assessed     SENSATION: WFL  POSTURE:   UPPER EXTREMITY ROM:   Active  ROM Right eval Left eval  Shoulder flexion A: 160 / P: 175 A:130 / P: 150  Shoulder extension    Shoulder abduction wfl A:110/ P: 135  Shoulder adduction    Shoulder internal rotation  Mild/mod limitation for behind the back.   Shoulder external rotation  60   Elbow flexion    Elbow extension    Wrist flexion    Wrist extension    Wrist ulnar deviation    Wrist radial deviation    Wrist pronation    Wrist supination    (Blank rows = not tested)   UPPER EXTREMITY MMT:  MMT Right eval Left eval  Shoulder flexion 5 4  Shoulder extension    Shoulder abduction 5 4  Shoulder adduction    Shoulder internal rotation 5 4+  Shoulder external rotation 5 4  Middle trapezius    Lower trapezius    Elbow flexion    Elbow extension    Wrist flexion    Wrist extension    Wrist ulnar deviation    Wrist radial deviation    Wrist pronation    Wrist supination    Grip strength (lbs)    (Blank rows = not tested)  SHOULDER SPECIAL TESTS:   JOINT MOBILITY TESTING:  Hypomobile L GHJ  PALPATION:  Hypomobile L GHJ.     TODAY'S TREATMENT:                                                                                                                                         DATE:    06/19/2024 Therapeutic Exercise:  prom L shoulder all motions;  Aerobic: UBE  x4 min fwd/bwd  Supine:  full shoulder flexion/arom x 12; horizontal abd x 10; pec stretch x 2 min on L, with wrist flex/ext for nerve glide;  S/L:   ER x 10, then x 10 with 1lb  Seated: pulleys x 20/flexion and abd  Standing:  Rows GTB 2 x 10;   bil ER YTB 2 x 10;   Stretches:  Neuromuscular Re-education: Manual Therapy: L shoulder, LAD, and GHJ joint mobs post and inf.  Therapeutic Activity: Self Care:   Previous:  Therapeutic Exercise: prom L shoulder all motions;  Aerobic:  Supine: shoulder ER butterfly x 15;  Seated: pulleys x 20/flexion and abd  Standing: wall slides 1 UE x 20; scap squeeze  x 15;  Ext with stick x 12,  Behind the back IR with stick x 12 Rows GTB 2 x 10;  Stretches:  Neuromuscular Re-education: Manual Therapy: L shoulder, LAD, and GHJ joint mobs post and inf.  Therapeutic Activity: Self Care:    Eval: ther ex: See below  for HEP Prom L shoulder- all motions. LAD.   PATIENT EDUCATION:  Education details: updated and reviewed HEP Person educated: Patient Education method: Explanation, Demonstration, Tactile cues, Verbal cues, and Handouts Education comprehension: verbalized understanding, returned demonstration, verbal cues required, tactile cues required, and needs further education   HOME EXERCISE PROGRAM: Access Code: [email or web address] URL: [email or web address] Date: 05/29/2024 Prepared by: Tinnie Don  Exercises - Supine Shoulder Flexion Extension AAROM with Dowel  - 1- 2 x daily - 5-7 x weekly - 1 sets - 10 reps - 3 hold - Supine Shoulder External Rotation in 45 Degrees Abduction AAROM with Dowel  - 1 x daily - 5-7 x weekly - 1 sets - 10 reps - 3 hold - Supine Chest Stretch with Elbows Bent  - 1-2 x daily - 5-7 x weekly - 1 sets - 10 reps - 3 hold - Standing Shoulder Internal Rotation Stretch with Hands Behind Back  - 1-2 x daily - 1 sets - 5-10 reps - 5 hold  ASSESSMENT:  CLINICAL IMPRESSION: Pt with good tolerance for exercises  today, and for light strengthening for ER. Plan to progress light strength as tolerated and continue to work towards pain free, full ROM.   Eval:  Patient presents with primary complaint of pain and rom limitations in L shoulder. She has GHJ stiffness and limited ROM for arom and prom. She has decreased strength in L shoulder, and decreased ability for full functional activities, reaching, lifting, carrying, and IADLs. She has had good results with PT in the past. Pt to benefit from skilled PT to improve deficits and pain.    OBJECTIVE IMPAIRMENTS: decreased activity tolerance, decreased ROM, decreased strength, impaired UE functional use, and pain.   ACTIVITY LIMITATIONS: carrying, lifting, dressing, reach over head, hygiene/grooming, and locomotion level  PARTICIPATION LIMITATIONS: meal prep, cleaning, driving, shopping, and community activity  PERSONAL FACTORS: Past/current experiences and Time since onset of injury/illness/exacerbation are also affecting patient's functional outcome.   REHAB POTENTIAL: Good  CLINICAL DECISION MAKING: Stable/uncomplicated  EVALUATION COMPLEXITY: Low  GOALS: Goals reviewed with patient? Yes   SHORT TERM GOALS: Target date: 06/19/2024   Pt to be intendment with initial HEP  Goal status: INITIAL  2.  Pt to demo improved AROM for flexion by at least 10 degrees   Goal status: INITIAL    LONG TERM GOALS: Target date: 08/21/2024   Pt to be independent with final HEP  Goal status: INITIAL  2.  Pt to demo improved AROM to be [facility] and pain free, to improve ability for ADLs.   Goal status: INITIAL  3.  Pt to demo improved strength to be at least 4+/5, to improve ability for reach, lift, carry, and IADLS   Goal status: INITIAL  4.  Pt to demo improved score on QuickDASH by at least 10 %.   Goal status: INITIAL    PLAN: PT FREQUENCY: 1-2x/week  PT DURATION: 12 weeks  PLANNED INTERVENTIONS: Therapeutic exercises, Therapeutic  activity, Neuromuscular re-education, Patient/Family education, Self Care, Joint mobilization, Joint manipulation, Stair training, DME instructions, Aquatic Therapy, Dry Needling, Electrical stimulation, Cryotherapy, Moist heat, Taping, Ultrasound, Ionotophoresis 4mg /ml Dexamethasone, Manual therapy,  Vasopneumatic device, Traction, Spinal manipulation, Spinal mobilization,    PLAN FOR NEXT SESSION:   Tinnie Don, PT, DPT 9:37 AM  06/19/2024

## 2024-06-24 ENCOUNTER — Encounter: Admitting: Physical Therapy

## 2024-06-25 ENCOUNTER — Ambulatory Visit: Admitting: Physical Therapy

## 2024-06-25 ENCOUNTER — Encounter: Payer: Self-pay | Admitting: Physical Therapy

## 2024-06-25 DIAGNOSIS — M25512 Pain in left shoulder: Secondary | ICD-10-CM

## 2024-06-25 DIAGNOSIS — M25612 Stiffness of left shoulder, not elsewhere classified: Secondary | ICD-10-CM

## 2024-06-25 DIAGNOSIS — G8929 Other chronic pain: Secondary | ICD-10-CM

## 2024-06-25 DIAGNOSIS — M62838 Other muscle spasm: Secondary | ICD-10-CM | POA: Diagnosis not present

## 2024-06-25 NOTE — Therapy (Signed)
 OUTPATIENT PHYSICAL THERAPY UPPER EXTREMITY TREATMENT    Patient Name: Jacqueline Silva MRN: [id number] DOB:11-Sep-1960, 63 y.o., female Today's Date: 06/25/2024  END OF SESSION:  PT End of Session - 06/25/24 1023     Visit Number 4    Number of Visits 16    Date for Recertification  08/21/24    Authorization Type UHC - not medicare    PT Start Time 1024    PT Stop Time 1104    PT Time Calculation (min) 40 min    Activity Tolerance Patient tolerated treatment well    Behavior During Therapy [facility] for tasks assessed/performed            Past Medical History:  Diagnosis Date   ASCUS on Pap smear 07/11/1995   Hemorrhoid    HSV-2 (herpes simplex virus 2) infection 10/09/2003   Hyperlipidemia    Osteoporosis 03/10/2012   Vitamin D  deficiency    Past Surgical History:  Procedure Laterality Date   BREAST BIOPSY  1986   ESOPHAGOGASTRODUODENOSCOPY     Dr. Kristie   Patient Active Problem List   Diagnosis Date Noted   Vaginismus 12/25/2022   Colon cancer screening 12/13/2021   Gastroesophageal reflux disease 11/24/2021   Osteoporosis 07/08/2014    PCP: Alm Heads   REFERRING PROVIDER: Alm Heads   REFERRING DIAG: L shoulder pain   THERAPY DIAG:  Chronic left shoulder pain  Stiffness of shoulder joint, left  Other muscle spasm  Rationale for Evaluation and Treatment: Rehabilitation  ONSET DATE:   SUBJECTIVE:                                                                                                                                                                                      SUBJECTIVE STATEMENT: Pt states doing ok , shoulder was sore after last session.  R hand has been numb while sleeping, and in AM.   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 Now continues to feel stiff and sore.  Previous pain in past years- PT was helpful.  Exercise: walks daily 30 min.    Hand dominance:  Right  PERTINENT HISTORY: Osteoporosis,    PAIN:  Are you having pain? Yes: NPRS scale: up to 4-5/10  with activity ,  not waking up with pain.  Pain location: L shoulder  Pain description: Achey Aggravating factors: Reaching up, behind, taking off shirt. Pain at night trying to sleep.  Relieving factors: none stated   PRECAUTIONS: None  RED FLAGS: None   WEIGHT BEARING RESTRICTIONS: No  FALLS:  Has patient fallen in last 6 months? No  PLOF: Independent  PATIENT GOALS: Decreased pain  NEXT MD VISIT:   OBJECTIVE:   DIAGNOSTIC FINDINGS:    PATIENT SURVEYS :  31.8/100  31/8 %   Quick Dash - Eval    COGNITION: Overall cognitive status: Within functional limits for tasks assessed     SENSATION: WFL  POSTURE:   UPPER EXTREMITY ROM:   Active  ROM Right eval Left eval  Shoulder flexion A: 160 / P: 175 A:130 / P: 150  Shoulder extension    Shoulder abduction wfl A:110/ P: 135  Shoulder adduction    Shoulder internal rotation  Mild/mod limitation for behind the back.   Shoulder external rotation  60   Elbow flexion    Elbow extension    Wrist flexion    Wrist extension    Wrist ulnar deviation    Wrist radial deviation    Wrist pronation    Wrist supination    (Blank rows = not tested)   UPPER EXTREMITY MMT:  MMT Right eval Left eval  Shoulder flexion 5 4  Shoulder extension    Shoulder abduction 5 4  Shoulder adduction    Shoulder internal rotation 5 4+  Shoulder external rotation 5 4  Middle trapezius    Lower trapezius    Elbow flexion    Elbow extension    Wrist flexion    Wrist extension    Wrist ulnar deviation    Wrist radial deviation    Wrist pronation    Wrist supination    Grip strength (lbs)    (Blank rows = not tested)  SHOULDER SPECIAL TESTS:   JOINT MOBILITY TESTING:  Hypomobile L GHJ  PALPATION:  Hypomobile L GHJ.     TODAY'S TREATMENT:                                                                                                                                          DATE:   06/25/2024 Therapeutic Exercise:  prom L shoulder all motions;  Aerobic:  Supine:  full shoulder flexion/arom x 10; horizontal abd x 10;   shoulder ER at 90/90 rom x 15;  SA reaches x 12;  S/L:   ER x 10, then x 10 with 1lb  Seated:  pulleys x 20/flexion and abd  Standing:  Rows GTB 2 x 10;   bil ER RTB  2 x 10;   Stretches:  Neuromuscular Re-education: Manual Therapy: L shoulder, LAD, and GHJ joint mobs post and inf; manually resisted IR/ER at 45 deg x 15;  Therapeutic Activity: Self Care:   Therapeutic Exercise:  prom L shoulder all motions;  Aerobic: UBE x4 min fwd/bwd  Supine:  full shoulder flexion/arom x 12; horizontal abd x 10; pec stretch x 2 min on L, with wrist flex/ext for nerve glide;  S/L:   ER x 10, then x 10 with 1lb  Seated: pulleys x  20/flexion and abd  Standing:  Rows GTB 2 x 10;   bil ER YTB 2 x 10;   Stretches:  Neuromuscular Re-education: Manual Therapy: L shoulder, LAD, and GHJ joint mobs post and inf.  Therapeutic Activity: Self Care:   Previous:  Therapeutic Exercise: prom L shoulder all motions;  Aerobic:  Supine: shoulder ER butterfly x 15;  Seated: pulleys x 20/flexion and abd  Standing: wall slides 1 UE x 20; scap squeeze  x 15;  Ext with stick x 12,  Behind the back IR with stick x 12 Rows GTB 2 x 10;  Stretches:  Neuromuscular Re-education: Manual Therapy: L shoulder, LAD, and GHJ joint mobs post and inf.  Therapeutic Activity: Self Care:    Eval: ther ex: See below for HEP Prom L shoulder- all motions. LAD.   PATIENT EDUCATION:  Education details: updated and reviewed HEP Person educated: Patient Education method: Explanation, Demonstration, Tactile cues, Verbal cues, and Handouts Education comprehension: verbalized understanding, returned demonstration, verbal cues required, tactile cues required, and needs further education   HOME EXERCISE PROGRAM: Access  Code: [email or web address] URL: [email or web address] Date: 05/29/2024 Prepared by: Tinnie Don  Exercises - Supine Shoulder Flexion Extension AAROM with Dowel  - 1- 2 x daily - 5-7 x weekly - 1 sets - 10 reps - 3 hold - Supine Shoulder External Rotation in 45 Degrees Abduction AAROM with Dowel  - 1 x daily - 5-7 x weekly - 1 sets - 10 reps - 3 hold - Supine Chest Stretch with Elbows Bent  - 1-2 x daily - 5-7 x weekly - 1 sets - 10 reps - 3 hold - Standing Shoulder Internal Rotation Stretch with Hands Behind Back  - 1-2 x daily - 1 sets - 5-10 reps - 5 hold  ASSESSMENT:  CLINICAL IMPRESSION: Pt with noted improvements in arom. She has stiffness and pain at end range, but overall improving. Will continue to work on end range motion as well as pain free, full AROM and strengthening as able.   Eval:  Patient presents with primary complaint of pain and rom limitations in L shoulder. She has GHJ stiffness and limited ROM for arom and prom. She has decreased strength in L shoulder, and decreased ability for full functional activities, reaching, lifting, carrying, and IADLs. She has had good results with PT in the past. Pt to benefit from skilled PT to improve deficits and pain.    OBJECTIVE IMPAIRMENTS: decreased activity tolerance, decreased ROM, decreased strength, impaired UE functional use, and pain.   ACTIVITY LIMITATIONS: carrying, lifting, dressing, reach over head, hygiene/grooming, and locomotion level  PARTICIPATION LIMITATIONS: meal prep, cleaning, driving, shopping, and community activity  PERSONAL FACTORS: Past/current experiences and Time since onset of injury/illness/exacerbation are also affecting patient's functional outcome.   REHAB POTENTIAL: Good  CLINICAL DECISION MAKING: Stable/uncomplicated  EVALUATION COMPLEXITY: Low  GOALS: Goals reviewed with patient? Yes   SHORT TERM GOALS: Target date: 06/19/2024   Pt to be intendment with initial HEP  Goal  status: MET  2.  Pt to demo improved AROM for flexion by at least 10 degrees   Goal status: MET    LONG TERM GOALS: Target date: 08/21/2024   Pt to be independent with final HEP  Goal status: INITIAL  2.  Pt to demo improved AROM to be [facility] and pain free, to improve ability for ADLs.   Goal status: INITIAL  3.  Pt to demo improved strength to be at least 4+/5, to improve ability for  reach, lift, carry, and IADLS   Goal status: INITIAL  4.  Pt to demo improved score on QuickDASH by at least 10 %.   Goal status: INITIAL    PLAN: PT FREQUENCY: 1-2x/week  PT DURATION: 12 weeks  PLANNED INTERVENTIONS: Therapeutic exercises, Therapeutic activity, Neuromuscular re-education, Patient/Family education, Self Care, Joint mobilization, Joint manipulation, Stair training, DME instructions, Aquatic Therapy, Dry Needling, Electrical stimulation, Cryotherapy, Moist heat, Taping, Ultrasound, Ionotophoresis 4mg /ml Dexamethasone, Manual therapy,  Vasopneumatic device, Traction, Spinal manipulation, Spinal mobilization,      PLAN FOR NEXT SESSION:   continue strength, add wall push ups   Tinnie Don, PT, DPT 10:23 AM  06/25/2024

## 2024-06-26 ENCOUNTER — Encounter: Admitting: Physical Therapy

## 2024-06-30 ENCOUNTER — Ambulatory Visit: Admitting: Behavioral Health

## 2024-07-01 ENCOUNTER — Encounter: Admitting: Physical Therapy

## 2024-07-08 ENCOUNTER — Encounter: Admitting: Physical Therapy

## 2024-07-11 ENCOUNTER — Encounter: Admitting: Physical Therapy

## 2024-07-15 ENCOUNTER — Encounter: Admitting: Physical Therapy

## 2024-07-17 ENCOUNTER — Ambulatory Visit: Admitting: Physical Therapy

## 2024-07-17 DIAGNOSIS — M25512 Pain in left shoulder: Secondary | ICD-10-CM

## 2024-07-17 DIAGNOSIS — G8929 Other chronic pain: Secondary | ICD-10-CM

## 2024-07-17 DIAGNOSIS — M25612 Stiffness of left shoulder, not elsewhere classified: Secondary | ICD-10-CM

## 2024-07-17 NOTE — Therapy (Unsigned)
 " OUTPATIENT PHYSICAL THERAPY UPPER EXTREMITY TREATMENT    Patient Name: Jacqueline Silva MRN: [id number] DOB:03/26/1961, 64 y.o., female Today's Date: 07/17/2024  END OF SESSION:  PT End of Session - 07/17/24 0857     Visit Number 5    Number of Visits 16    Date for Recertification  08/21/24    Authorization Type UHC - not medicare    PT Start Time 0850    PT Stop Time 0930    PT Time Calculation (min) 40 min    Activity Tolerance Patient tolerated treatment well    Behavior During Therapy [facility] for tasks assessed/performed             Past Medical History:  Diagnosis Date   ASCUS on Pap smear 07/11/1995   Hemorrhoid    HSV-2 (herpes simplex virus 2) infection 10/09/2003   Hyperlipidemia    Osteoporosis 03/10/2012   Vitamin D  deficiency    Past Surgical History:  Procedure Laterality Date   BREAST BIOPSY  1986   ESOPHAGOGASTRODUODENOSCOPY     Dr. Kristie   Patient Active Problem List   Diagnosis Date Noted   Vaginismus 12/25/2022   Colon cancer screening 12/13/2021   Gastroesophageal reflux disease 11/24/2021   Osteoporosis 07/08/2014    PCP: Alm Heads   REFERRING PROVIDER: Alm Heads   REFERRING DIAG: L shoulder pain   THERAPY DIAG:  No diagnosis found.  Rationale for Evaluation and Treatment: Rehabilitation  ONSET DATE:   SUBJECTIVE:                                                                                                                                                                                      SUBJECTIVE STATEMENT: Pt states doing ok , shoulder still sore with some motions, lifting up and out.   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 Now continues to feel stiff and sore.  Previous pain in past years- PT was helpful.  Exercise: walks daily 30 min.    Hand dominance: Right  PERTINENT HISTORY: Osteoporosis,    PAIN:  Are you having pain? Yes: NPRS scale: up to  2-3/10  with activity ,  not waking up with pain.  Pain location: L shoulder  Pain description: Achey Aggravating factors: Reaching up, behind, taking off shirt. Pain at night trying to sleep.  Relieving factors: none stated   PRECAUTIONS: None  RED FLAGS: None   WEIGHT BEARING RESTRICTIONS: No  FALLS:  Has patient fallen in last 6 months? No   PLOF: Independent  PATIENT GOALS: Decreased pain  NEXT MD VISIT:   OBJECTIVE:  DIAGNOSTIC FINDINGS:    PATIENT SURVEYS :  31.8/100  31.8 %   Quick Dash - Eval                   27.3 %  Quick Dash-   07/17/24   COGNITION: Overall cognitive status: Within functional limits for tasks assessed     SENSATION: WFL  POSTURE:   UPPER EXTREMITY ROM:   Active  ROM Right eval Left eval 07/17/24  Shoulder flexion A: 160 / P: 175 A:130 / P: 150 A: 150 P:  155  Shoulder extension     Shoulder abduction wfl A:110/ P: 135 A: 140  Shoulder adduction     Shoulder internal rotation  Mild/mod limitation for behind the back.  Very mild limitation vs R side,very mild pain  Shoulder external rotation  60  WFL  Elbow flexion     Elbow extension     Wrist flexion     Wrist extension     Wrist ulnar deviation     Wrist radial deviation     Wrist pronation     Wrist supination     (Blank rows = not tested)   UPPER EXTREMITY MMT:  MMT Right eval Left eval  Shoulder flexion 5 4  Shoulder extension    Shoulder abduction 5 4  Shoulder adduction    Shoulder internal rotation 5 4+  Shoulder external rotation 5 4  Middle trapezius    Lower trapezius    Elbow flexion    Elbow extension    Wrist flexion    Wrist extension    Wrist ulnar deviation    Wrist radial deviation    Wrist pronation    Wrist supination    Grip strength (lbs)    (Blank rows = not tested)  SHOULDER SPECIAL TESTS:   JOINT MOBILITY TESTING:  Hypomobile L GHJ  PALPATION:  Hypomobile L GHJ.     TODAY'S TREATMENT:                                                                                                                                          DATE:   07/17/2024 Therapeutic Exercise:  prom L shoulder all motions;  Aerobic:  Supine:  full shoulder flexion/arom x 10; horizontal abd x 10;   shoulder ER at 90/90 rom x 15;  SA reaches x 12;  S/L:   ER x 10, then x 10 with 1lb  Seated:  pulleys x 20/flexion and abd ;   Standing:  Rows Blue TB 2 x 10;   bil ER RTB  2 x 10;    wall push ups x 15;  Stretches:  Neuromuscular Re-education: Manual Therapy: L shoulder, LAD, and GHJ joint mobs post and inf; manually resisted IR/ER at 45 deg x 15;  Therapeutic Activity: Self Care:    Therapeutic Exercise:  prom L shoulder all motions;  Aerobic:  Supine:  full shoulder flexion/arom x 10; horizontal abd x 10;   shoulder ER at 90/90 rom x 15;  SA reaches x 12;  S/L:   ER x 10, then x 10 with 1lb  Seated:  pulleys x 20/flexion and abd  Standing:  Rows GTB 2 x 10;   bil ER RTB  2 x 10;   Stretches:  Neuromuscular Re-education: Manual Therapy: L shoulder, LAD, and GHJ joint mobs post and inf; manually resisted IR/ER at 45 deg x 15;  Therapeutic Activity: Self Care:   Therapeutic Exercise:  prom L shoulder all motions;  Aerobic: UBE x4 min fwd/bwd  Supine:  full shoulder flexion/arom x 12; horizontal abd x 10; pec stretch x 2 min on L, with wrist flex/ext for nerve glide;  S/L:   ER x 10, then x 10 with 1lb  Seated: pulleys x 20/flexion and abd  Standing:  Rows GTB 2 x 10;   bil ER YTB 2 x 10;   Stretches:  Neuromuscular Re-education: Manual Therapy: L shoulder, LAD, and GHJ joint mobs post and inf.  Therapeutic Activity: Self Care:   Previous:  Therapeutic Exercise: prom L shoulder all motions;  Aerobic:  Supine: shoulder ER butterfly x 15;  Seated: pulleys x 20/flexion and abd  Standing: wall slides 1 UE x 20; scap squeeze  x 15;  Ext with stick x 12,  Behind the back IR with stick x 12 Rows GTB 2 x 10;  Stretches:   Neuromuscular Re-education: Manual Therapy: L shoulder, LAD, and GHJ joint mobs post and inf.  Therapeutic Activity: Self Care:    Eval: ther ex: See below for HEP Prom L shoulder- all motions. LAD.   PATIENT EDUCATION:  Education details: updated and reviewed HEP Person educated: Patient Education method: Explanation, Demonstration, Tactile cues, Verbal cues, and Handouts Education comprehension: verbalized understanding, returned demonstration, verbal cues required, tactile cues required, and needs further education   HOME EXERCISE PROGRAM: Access Code: [email or web address] URL: [email or web address] Date: 05/29/2024 Prepared by: Tinnie Don  Exercises - Supine Shoulder Flexion Extension AAROM with Dowel  - 1- 2 x daily - 5-7 x weekly - 1 sets - 10 reps - 3 hold - Supine Shoulder External Rotation in 45 Degrees Abduction AAROM with Dowel  - 1 x daily - 5-7 x weekly - 1 sets - 10 reps - 3 hold - Supine Chest Stretch with Elbows Bent  - 1-2 x daily - 5-7 x weekly - 1 sets - 10 reps - 3 hold - Standing Shoulder Internal Rotation Stretch with Hands Behind Back  - 1-2 x daily - 1 sets - 5-10 reps - 5 hold  ASSESSMENT:  CLINICAL IMPRESSION: Much improved behind the back IR motion.   Eval:  Patient presents with primary complaint of pain and rom limitations in L shoulder. She has GHJ stiffness and limited ROM for arom and prom. She has decreased strength in L shoulder, and decreased ability for full functional activities, reaching, lifting, carrying, and IADLs. She has had good results with PT in the past. Pt to benefit from skilled PT to improve deficits and pain.    OBJECTIVE IMPAIRMENTS: decreased activity tolerance, decreased ROM, decreased strength, impaired UE functional use, and pain.   ACTIVITY LIMITATIONS: carrying, lifting, dressing, reach over head, hygiene/grooming, and locomotion level  PARTICIPATION LIMITATIONS: meal prep, cleaning, driving, shopping, and  community activity  PERSONAL FACTORS: Past/current experiences and Time since onset of injury/illness/exacerbation are also affecting patient's functional outcome.  REHAB POTENTIAL: Good  CLINICAL DECISION MAKING: Stable/uncomplicated  EVALUATION COMPLEXITY: Low  GOALS: Goals reviewed with patient? Yes   SHORT TERM GOALS: Target date: 06/19/2024   Pt to be intendment with initial HEP  Goal status: MET  2.  Pt to demo improved AROM for flexion by at least 10 degrees   Goal status: MET    LONG TERM GOALS: Target date: 08/21/2024   Pt to be independent with final HEP  Goal status: INITIAL  2.  Pt to demo improved AROM to be [facility] and pain free, to improve ability for ADLs.   Goal status: INITIAL  3.  Pt to demo improved strength to be at least 4+/5, to improve ability for reach, lift, carry, and IADLS   Goal status: INITIAL  4.  Pt to demo improved score on QuickDASH by at least 10 %.   Goal status: INITIAL    PLAN: PT FREQUENCY: 1-2x/week  PT DURATION: 12 weeks  PLANNED INTERVENTIONS: Therapeutic exercises, Therapeutic activity, Neuromuscular re-education, Patient/Family education, Self Care, Joint mobilization, Joint manipulation, Stair training, DME instructions, Aquatic Therapy, Dry Needling, Electrical stimulation, Cryotherapy, Moist heat, Taping, Ultrasound, Ionotophoresis 4mg /ml Dexamethasone, Manual therapy,  Vasopneumatic device, Traction, Spinal manipulation, Spinal mobilization,      PLAN FOR NEXT SESSION:  abduction, s/l abduction,  end range flexion motion.   Tinnie Don, PT, DPT 8:57 AM  07/17/2024     "

## 2024-07-20 ENCOUNTER — Encounter: Payer: Self-pay | Admitting: Physical Therapy

## 2024-07-22 ENCOUNTER — Encounter: Admitting: Physical Therapy

## 2024-07-24 ENCOUNTER — Ambulatory Visit: Admitting: Physical Therapy

## 2024-07-24 ENCOUNTER — Encounter: Payer: Self-pay | Admitting: Physical Therapy

## 2024-07-24 DIAGNOSIS — M25612 Stiffness of left shoulder, not elsewhere classified: Secondary | ICD-10-CM

## 2024-07-24 DIAGNOSIS — M62838 Other muscle spasm: Secondary | ICD-10-CM | POA: Diagnosis not present

## 2024-07-24 DIAGNOSIS — G8929 Other chronic pain: Secondary | ICD-10-CM | POA: Diagnosis not present

## 2024-07-24 DIAGNOSIS — M25512 Pain in left shoulder: Secondary | ICD-10-CM | POA: Diagnosis not present

## 2024-07-24 NOTE — Therapy (Signed)
 " OUTPATIENT PHYSICAL THERAPY UPPER EXTREMITY TREATMENT    Patient Name: Jacqueline Silva MRN: [id number] DOB:1960/10/09, 64 y.o., female Today's Date: 07/24/24   END OF SESSION:  PT End of Session - 07/24/24 0845     Visit Number 6    Number of Visits 16    Date for Recertification  08/21/24    Authorization Type UHC - not medicare    PT Start Time 0847    PT Stop Time 0927    PT Time Calculation (min) 40 min    Activity Tolerance Patient tolerated treatment well    Behavior During Therapy [facility] for tasks assessed/performed             Past Medical History:  Diagnosis Date   ASCUS on Pap smear 07/11/1995   Hemorrhoid    HSV-2 (herpes simplex virus 2) infection 10/09/2003   Hyperlipidemia    Osteoporosis 03/10/2012   Vitamin D  deficiency    Past Surgical History:  Procedure Laterality Date   BREAST BIOPSY  1986   ESOPHAGOGASTRODUODENOSCOPY     Dr. Kristie   Patient Active Problem List   Diagnosis Date Noted   Vaginismus 12/25/2022   Colon cancer screening 12/13/2021   Gastroesophageal reflux disease 11/24/2021   Osteoporosis 07/08/2014    PCP: Alm Heads   REFERRING PROVIDER: Alm Heads   REFERRING DIAG: L shoulder pain   THERAPY DIAG:  Chronic left shoulder pain  Stiffness of shoulder joint, left  Other muscle spasm  Rationale for Evaluation and Treatment: Rehabilitation  ONSET DATE:   SUBJECTIVE:                                                                                                                                                                                      SUBJECTIVE STATEMENT: Pt states doing ok , shoulder better overall. It is sore this am, not sure why.   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 Now continues to feel stiff and sore.  Previous pain in past years- PT was helpful.  Exercise: walks daily 30 min.    Hand dominance: Right  PERTINENT  HISTORY: Osteoporosis,    PAIN:  Are you having pain? Yes: NPRS scale: up to 2-3/10  with activity ,  not waking up with pain.  Pain location: L shoulder  Pain description: Achey Aggravating factors: Reaching up, behind, taking off shirt. Pain at night trying to sleep.  Relieving factors: none stated   PRECAUTIONS: None  RED FLAGS: None   WEIGHT BEARING RESTRICTIONS: No  FALLS:  Has patient fallen in last 6 months? No   PLOF:  Independent  PATIENT GOALS: Decreased pain  NEXT MD VISIT:   OBJECTIVE:   DIAGNOSTIC FINDINGS:    PATIENT SURVEYS :  31.8/100  31.8 %   Quick Dash - Eval                   27.3 %  Quick Dash-   07/17/24   COGNITION: Overall cognitive status: Within functional limits for tasks assessed     SENSATION: WFL  POSTURE:   UPPER EXTREMITY ROM:   Active  ROM Right eval Left eval 07/17/24  Shoulder flexion A: 160 / P: 175 A:130 / P: 150 A: 150 P:  155  Shoulder extension     Shoulder abduction wfl A:110/ P: 135 A: 140  Shoulder adduction     Shoulder internal rotation  Mild/mod limitation for behind the back.  Very mild limitation vs R side,very mild pain  Shoulder external rotation  60  WFL  Elbow flexion     Elbow extension     Wrist flexion     Wrist extension     Wrist ulnar deviation     Wrist radial deviation     Wrist pronation     Wrist supination     (Blank rows = not tested)   UPPER EXTREMITY MMT:  MMT Right eval Left eval  Shoulder flexion 5 4  Shoulder extension    Shoulder abduction 5 4  Shoulder adduction    Shoulder internal rotation 5 4+  Shoulder external rotation 5 4  Middle trapezius    Lower trapezius    Elbow flexion    Elbow extension    Wrist flexion    Wrist extension    Wrist ulnar deviation    Wrist radial deviation    Wrist pronation    Wrist supination    Grip strength (lbs)    (Blank rows = not tested)  SHOULDER SPECIAL TESTS:   JOINT MOBILITY TESTING:  Hypomobile L GHJ  PALPATION:   Hypomobile L GHJ.     TODAY'S TREATMENT:                                                                                                                                         DATE:   07/24/24 Therapeutic Exercise:  prom L shoulder all motions;  Aerobic:  Supine:   S/L:    Seated:  pulleys x 20/flexion and abd ;   Standing:  Rows Blue TB 2 x 10;   bil ER RTB  2 x 10;    wall push ups  2 x 10;  Arom-abd 2 positions x 10 ea (to 90 deg).   Full abd x 12;  arom- full flexion x 12;  goal post flexion x 10;  Stretches:  doorway stretch x 3;  IR behind back, 3 positions 2 x 5 each;  Neuromuscular Re-education: Manual Therapy: L shoulder, LAD, and GHJ  joint mobs post and inf;  Therapeutic Activity: Self Care:    07/17/2024 Therapeutic Exercise:  prom L shoulder all motions;  Aerobic:  Supine:  aarom/stick x 10; full shoulder flexion/arom x 10; horizontal abd x 10;   shoulder ER at 90/90 rom x 15;  IR behind back, 3 positions 2 x 5 each;  S/L:    Seated:  pulleys x 20/flexion and abd ;   Standing:  Rows Blue TB 2 x 10;   bil ER RTB  2 x 10;    wall push ups x 15;  Stretches:  Neuromuscular Re-education: Manual Therapy: L shoulder, LAD, and GHJ joint mobs post and inf;  Therapeutic Activity: Self Care:    PATIENT EDUCATION:  Education details: updated and reviewed HEP Person educated: Patient Education method: Explanation, Demonstration, Tactile cues, Verbal cues, and Handouts Education comprehension: verbalized understanding, returned demonstration, verbal cues required, tactile cues required, and needs further education   HOME EXERCISE PROGRAM: Access Code: [email or web address] URL: [email or web address] Date: 05/29/2024 Prepared by: Tinnie Don  Exercises - Supine Shoulder Flexion Extension AAROM with Dowel  - 1- 2 x daily - 5-7 x weekly - 1 sets - 10 reps - 3 hold - Supine Shoulder External Rotation in 45 Degrees Abduction AAROM with Dowel  - 1 x daily - 5-7 x  weekly - 1 sets - 10 reps - 3 hold - Supine Chest Stretch with Elbows Bent  - 1-2 x daily - 5-7 x weekly - 1 sets - 10 reps - 3 hold - Standing Shoulder Internal Rotation Stretch with Hands Behind Back  - 1-2 x daily - 1 sets - 5-10 reps - 5 hold  ASSESSMENT:  CLINICAL IMPRESSION: Pt progressing well with rom and pain. She has much improved ability for abduction rom today without pain. She has mild stiffness at end range for flexion, but improving each week. Pt to benefit from continued care.   Eval:  Patient presents with primary complaint of pain and rom limitations in L shoulder. She has GHJ stiffness and limited ROM for arom and prom. She has decreased strength in L shoulder, and decreased ability for full functional activities, reaching, lifting, carrying, and IADLs. She has had good results with PT in the past. Pt to benefit from skilled PT to improve deficits and pain.    OBJECTIVE IMPAIRMENTS: decreased activity tolerance, decreased ROM, decreased strength, impaired UE functional use, and pain.   ACTIVITY LIMITATIONS: carrying, lifting, dressing, reach over head, hygiene/grooming, and locomotion level  PARTICIPATION LIMITATIONS: meal prep, cleaning, driving, shopping, and community activity  PERSONAL FACTORS: Past/current experiences and Time since onset of injury/illness/exacerbation are also affecting patient's functional outcome.   REHAB POTENTIAL: Good  CLINICAL DECISION MAKING: Stable/uncomplicated  EVALUATION COMPLEXITY: Low  GOALS: Goals reviewed with patient? Yes   SHORT TERM GOALS: Target date: 06/19/2024   Pt to be intendment with initial HEP  Goal status: MET  2.  Pt to demo improved AROM for flexion by at least 10 degrees   Goal status: MET    LONG TERM GOALS: Target date: 08/21/2024   Pt to be independent with final HEP  Goal status: In progress  2.  Pt to demo improved AROM to be [facility] and pain free, to improve ability for ADLs.   Goal status:  In progress  3.  Pt to demo improved strength to be at least 4+/5, to improve ability for reach, lift, carry, and IADLS   Goal status: In progress  4.  Pt to  demo improved score on QuickDASH by at least 10 %.   Goal status: In progress    PLAN: PT FREQUENCY: 1-2x/week  PT DURATION: 12 weeks  PLANNED INTERVENTIONS: Therapeutic exercises, Therapeutic activity, Neuromuscular re-education, Patient/Family education, Self Care, Joint mobilization, Joint manipulation, Stair training, DME instructions, Aquatic Therapy, Dry Needling, Electrical stimulation, Cryotherapy, Moist heat, Taping, Ultrasound, Ionotophoresis 4mg /ml Dexamethasone, Manual therapy,  Vasopneumatic device, Traction, Spinal manipulation, Spinal mobilization,      PLAN FOR NEXT SESSION:  abduction, s/l abduction,  end range flexion motion.   Tinnie Don, PT, DPT 11:24 AM  07/24/24     "

## 2024-07-31 ENCOUNTER — Encounter: Payer: Self-pay | Admitting: Physical Therapy

## 2024-07-31 ENCOUNTER — Ambulatory Visit: Admitting: Physical Therapy

## 2024-07-31 DIAGNOSIS — M25612 Stiffness of left shoulder, not elsewhere classified: Secondary | ICD-10-CM | POA: Diagnosis not present

## 2024-07-31 DIAGNOSIS — M62838 Other muscle spasm: Secondary | ICD-10-CM | POA: Diagnosis not present

## 2024-07-31 DIAGNOSIS — G8929 Other chronic pain: Secondary | ICD-10-CM | POA: Diagnosis not present

## 2024-07-31 DIAGNOSIS — M25512 Pain in left shoulder: Secondary | ICD-10-CM

## 2024-07-31 NOTE — Therapy (Signed)
 " OUTPATIENT PHYSICAL THERAPY UPPER EXTREMITY TREATMENT    Patient Name: Jacqueline Silva MRN: [id number] DOB:1961-04-14, 64 y.o., female Today's Date: 07/31/24   END OF SESSION:  PT End of Session - 07/31/24 1018     Visit Number 7    Number of Visits 16    Date for Recertification  08/21/24    Authorization Type UHC - not medicare    PT Start Time 1020    PT Stop Time 1100    PT Time Calculation (min) 40 min    Activity Tolerance Patient tolerated treatment well    Behavior During Therapy [facility] for tasks assessed/performed             Past Medical History:  Diagnosis Date   ASCUS on Pap smear 07/11/1995   Hemorrhoid    HSV-2 (herpes simplex virus 2) infection 10/09/2003   Hyperlipidemia    Osteoporosis 03/10/2012   Vitamin D  deficiency    Past Surgical History:  Procedure Laterality Date   BREAST BIOPSY  1986   ESOPHAGOGASTRODUODENOSCOPY     Dr. Kristie   Patient Active Problem List   Diagnosis Date Noted   Vaginismus 12/25/2022   Colon cancer screening 12/13/2021   Gastroesophageal reflux disease 11/24/2021   Osteoporosis 07/08/2014    PCP: Alm Heads   REFERRING PROVIDER: Alm Heads   REFERRING DIAG: L shoulder pain   THERAPY DIAG:  Chronic left shoulder pain  Stiffness of shoulder joint, left  Other muscle spasm  Rationale for Evaluation and Treatment: Rehabilitation  ONSET DATE:   SUBJECTIVE:                                                                                                                                                                                      SUBJECTIVE STATEMENT: Pt states doing ok , shoulder feels tight with certain reaching motions.   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 Now continues to feel stiff and sore.  Previous pain in past years- PT was helpful.  Exercise: walks daily 30 min.    Hand dominance: Right  PERTINENT  HISTORY: Osteoporosis,    PAIN:  Are you having pain? Yes: NPRS scale: up to 2-3/10  with activity ,  not waking up with pain.  Pain location: L shoulder  Pain description: Achey Aggravating factors: Reaching up, behind, taking off shirt. Pain at night trying to sleep.  Relieving factors: none stated   PRECAUTIONS: None  RED FLAGS: None   WEIGHT BEARING RESTRICTIONS: No  FALLS:  Has patient fallen in last 6 months? No   PLOF: Independent  PATIENT GOALS:  Decreased pain  NEXT MD VISIT:   OBJECTIVE:   DIAGNOSTIC FINDINGS:    PATIENT SURVEYS :  31.8/100  31.8 %   Quick Dash - Eval                   27.3 %  Quick Dash-   07/17/24   COGNITION: Overall cognitive status: Within functional limits for tasks assessed     SENSATION: WFL  POSTURE:   UPPER EXTREMITY ROM:   Active  ROM Right eval Left eval 07/17/24  Shoulder flexion A: 160 / P: 175 A:130 / P: 150 A: 150 P:  155  Shoulder extension     Shoulder abduction wfl A:110/ P: 135 A: 140  Shoulder adduction     Shoulder internal rotation  Mild/mod limitation for behind the back.  Very mild limitation vs R side,very mild pain  Shoulder external rotation  60  WFL  Elbow flexion     Elbow extension     Wrist flexion     Wrist extension     Wrist ulnar deviation     Wrist radial deviation     Wrist pronation     Wrist supination     (Blank rows = not tested)   UPPER EXTREMITY MMT:  MMT Right eval Left eval  Shoulder flexion 5 4  Shoulder extension    Shoulder abduction 5 4  Shoulder adduction    Shoulder internal rotation 5 4+  Shoulder external rotation 5 4  Middle trapezius    Lower trapezius    Elbow flexion    Elbow extension    Wrist flexion    Wrist extension    Wrist ulnar deviation    Wrist radial deviation    Wrist pronation    Wrist supination    Grip strength (lbs)    (Blank rows = not tested)  SHOULDER SPECIAL TESTS:   JOINT MOBILITY TESTING:  Hypomobile L GHJ  PALPATION:   Hypomobile L GHJ.     TODAY'S TREATMENT:                                                                                                                                         DATE:   07/31/24: Therapeutic Exercise:  prom L shoulder all motions;  Aerobic: UBE x 4 min, fwd, bwd.  Supine:  chest press 3lb x 15; horiz abd x 10 arom;  S/L:    Seated:   Standing:  Rows Blue TB 2 x 10;   bil ER RTB  2 x 10;    wall push ups  2 x 10;  Arom-abd  to 90 deg 2lb x 10 ;   Full abd x 12;  arom- full flexion x 12;   2lb UE reach varied positions x 10 bil;  Stretches:  doorway stretch x 3;  Neuromuscular Re-education: Manual Therapy: L shoulder, LAD, and GHJ joint  mobs post and inf;  Therapeutic Activity: Self Care:   07/24/24 Therapeutic Exercise:  prom L shoulder all motions;  Aerobic:  Supine:   S/L:    Seated:  pulleys x 20/flexion and abd ;   Standing:  Rows Blue TB 2 x 10;   bil ER RTB  2 x 10;    wall push ups  2 x 10;  Arom-abd 2 positions x 10 ea (to 90 deg).   Full abd x 12;  arom- full flexion x 12;  goal post flexion x 10;  Stretches:  doorway stretch x 3;  IR behind back, 3 positions 2 x 5 each;  Neuromuscular Re-education: Manual Therapy: L shoulder, LAD, and GHJ joint mobs post and inf;  Therapeutic Activity: Self Care:    07/17/2024 Therapeutic Exercise:  prom L shoulder all motions;  Aerobic:  Supine:  aarom/stick x 10; full shoulder flexion/arom x 10; horizontal abd x 10;   shoulder ER at 90/90 rom x 15;  IR behind back, 3 positions 2 x 5 each;  S/L:    Seated:  pulleys x 20/flexion and abd ;   Standing:  Rows Blue TB 2 x 10;   bil ER RTB  2 x 10;    wall push ups x 15;  Stretches:  Neuromuscular Re-education: Manual Therapy: L shoulder, LAD, and GHJ joint mobs post and inf;  Therapeutic Activity: Self Care:    PATIENT EDUCATION:  Education details: updated and reviewed HEP Person educated: Patient Education method: Explanation, Demonstration, Tactile  cues, Verbal cues, and Handouts Education comprehension: verbalized understanding, returned demonstration, verbal cues required, tactile cues required, and needs further education   HOME EXERCISE PROGRAM: Access Code: [email or web address] URL: [email or web address] Date: 05/29/2024 Prepared by: Tinnie Don  Exercises - Supine Shoulder Flexion Extension AAROM with Dowel  - 1- 2 x daily - 5-7 x weekly - 1 sets - 10 reps - 3 hold - Supine Shoulder External Rotation in 45 Degrees Abduction AAROM with Dowel  - 1 x daily - 5-7 x weekly - 1 sets - 10 reps - 3 hold - Supine Chest Stretch with Elbows Bent  - 1-2 x daily - 5-7 x weekly - 1 sets - 10 reps - 3 hold - Standing Shoulder Internal Rotation Stretch with Hands Behind Back  - 1-2 x daily - 1 sets - 5-10 reps - 5 hold  ASSESSMENT:  CLINICAL IMPRESSION: Pt progressing well. She has improving rom for full range with most motions today. She has mild stiffness for end range ER and flexion, but much improved. She is improving with strengthening, will continue to benefit from strength.  Updated HEP today. Pt to benefit from  1-2 more visits, likely working towards d/c.   Pt progressing well with rom and pain. She has much improved ability for abduction rom today without pain. She has mild stiffness at end range for flexion, but improving each week. Pt to benefit from continued care.   Eval:  Patient presents with primary complaint of pain and rom limitations in L shoulder. She has GHJ stiffness and limited ROM for arom and prom. She has decreased strength in L shoulder, and decreased ability for full functional activities, reaching, lifting, carrying, and IADLs. She has had good results with PT in the past. Pt to benefit from skilled PT to improve deficits and pain.    OBJECTIVE IMPAIRMENTS: decreased activity tolerance, decreased ROM, decreased strength, impaired UE functional use, and pain.   ACTIVITY LIMITATIONS: carrying, lifting,  dressing,  reach over head, hygiene/grooming, and locomotion level  PARTICIPATION LIMITATIONS: meal prep, cleaning, driving, shopping, and community activity  PERSONAL FACTORS: Past/current experiences and Time since onset of injury/illness/exacerbation are also affecting patient's functional outcome.   REHAB POTENTIAL: Good  CLINICAL DECISION MAKING: Stable/uncomplicated  EVALUATION COMPLEXITY: Low  GOALS: Goals reviewed with patient? Yes   SHORT TERM GOALS: Target date: 06/19/2024   Pt to be intendment with initial HEP  Goal status: MET  2.  Pt to demo improved AROM for flexion by at least 10 degrees   Goal status: MET    LONG TERM GOALS: Target date: 08/21/2024   Pt to be independent with final HEP  Goal status: In progress  2.  Pt to demo improved AROM to be [facility] and pain free, to improve ability for ADLs.   Goal status: In progress  3.  Pt to demo improved strength to be at least 4+/5, to improve ability for reach, lift, carry, and IADLS   Goal status: In progress  4.  Pt to demo improved score on QuickDASH by at least 10 %.   Goal status: In progress    PLAN: PT FREQUENCY: 1-2x/week  PT DURATION: 12 weeks  PLANNED INTERVENTIONS: Therapeutic exercises, Therapeutic activity, Neuromuscular re-education, Patient/Family education, Self Care, Joint mobilization, Joint manipulation, Stair training, DME instructions, Aquatic Therapy, Dry Needling, Electrical stimulation, Cryotherapy, Moist heat, Taping, Ultrasound, Ionotophoresis 4mg /ml Dexamethasone, Manual therapy,  Vasopneumatic device, Traction, Spinal manipulation, Spinal mobilization,      PLAN FOR NEXT SESSION:  abduction, s/l abduction,  end range flexion motion.   Tinnie Don, PT, DPT 2:17 PM  07/31/24     "

## 2024-08-05 ENCOUNTER — Encounter: Admitting: Physical Therapy

## 2024-08-12 ENCOUNTER — Encounter: Payer: Self-pay | Admitting: Physical Therapy

## 2024-08-12 ENCOUNTER — Ambulatory Visit: Admitting: Physical Therapy

## 2024-08-12 DIAGNOSIS — M25612 Stiffness of left shoulder, not elsewhere classified: Secondary | ICD-10-CM | POA: Diagnosis not present

## 2024-08-12 DIAGNOSIS — M62838 Other muscle spasm: Secondary | ICD-10-CM

## 2024-08-12 DIAGNOSIS — M25512 Pain in left shoulder: Secondary | ICD-10-CM | POA: Diagnosis not present

## 2024-08-12 DIAGNOSIS — G8929 Other chronic pain: Secondary | ICD-10-CM

## 2024-08-26 ENCOUNTER — Encounter: Admitting: Physical Therapy
